# Patient Record
Sex: Female | Born: 1965
Health system: Southern US, Community
[De-identification: ages and names within clinical notes are randomized; demographics above are authoritative.]

## PROBLEM LIST (undated history)

## (undated) DIAGNOSIS — D649 Anemia, unspecified: Secondary | ICD-10-CM

## (undated) HISTORY — PX: TUBAL LIGATION: SHX77

---

## 2002-01-29 ENCOUNTER — Encounter: Payer: Self-pay | Admitting: Internal Medicine

## 2002-01-29 ENCOUNTER — Ambulatory Visit (HOSPITAL_COMMUNITY): Admission: RE | Admit: 2002-01-29 | Discharge: 2002-01-29 | Payer: Self-pay | Admitting: Internal Medicine

## 2002-07-19 ENCOUNTER — Emergency Department (HOSPITAL_COMMUNITY): Admission: EM | Admit: 2002-07-19 | Discharge: 2002-07-19 | Payer: Self-pay | Admitting: Emergency Medicine

## 2002-07-28 ENCOUNTER — Ambulatory Visit (HOSPITAL_COMMUNITY): Admission: RE | Admit: 2002-07-28 | Discharge: 2002-07-28 | Payer: Self-pay | Admitting: Family Medicine

## 2002-07-28 ENCOUNTER — Encounter: Payer: Self-pay | Admitting: Family Medicine

## 2002-09-21 ENCOUNTER — Ambulatory Visit (HOSPITAL_COMMUNITY): Admission: RE | Admit: 2002-09-21 | Discharge: 2002-09-21 | Payer: Self-pay | Admitting: Family Medicine

## 2004-09-13 ENCOUNTER — Ambulatory Visit: Payer: Self-pay | Admitting: Family Medicine

## 2004-09-18 ENCOUNTER — Ambulatory Visit (HOSPITAL_COMMUNITY): Admission: RE | Admit: 2004-09-18 | Discharge: 2004-09-18 | Payer: Self-pay | Admitting: Family Medicine

## 2008-12-08 ENCOUNTER — Encounter: Payer: Self-pay | Admitting: Family Medicine

## 2010-08-11 ENCOUNTER — Ambulatory Visit (INDEPENDENT_AMBULATORY_CARE_PROVIDER_SITE_OTHER): Payer: 59 | Admitting: Family Medicine

## 2010-08-11 ENCOUNTER — Ambulatory Visit: Payer: Self-pay | Admitting: Family Medicine

## 2010-08-11 ENCOUNTER — Encounter: Payer: Self-pay | Admitting: Family Medicine

## 2010-08-11 VITALS — BP 128/84 | HR 84 | Resp 16 | Ht 64.25 in | Wt 168.4 lb

## 2010-08-11 DIAGNOSIS — Z1239 Encounter for other screening for malignant neoplasm of breast: Secondary | ICD-10-CM

## 2010-08-11 DIAGNOSIS — E663 Overweight: Secondary | ICD-10-CM

## 2010-08-11 DIAGNOSIS — Z6825 Body mass index (BMI) 25.0-25.9, adult: Secondary | ICD-10-CM

## 2010-08-11 DIAGNOSIS — N920 Excessive and frequent menstruation with regular cycle: Secondary | ICD-10-CM

## 2010-08-11 NOTE — Assessment & Plan Note (Signed)
Patient will return for a complete GYN physical. She will need a Pap smear done. Will likely obtain pelvic ultrasound to view her endometrial stripe. CBC will also be obtained. She's had a tubal ligation for contraception.

## 2010-08-11 NOTE — Assessment & Plan Note (Signed)
Discussed diet and exercise. Patient will have fasting lipid panel and basic metabolic panel obtained as this has not been done.

## 2010-08-11 NOTE — Progress Notes (Signed)
  Subjective:    Patient ID: Breanna Campbell, female    DOB: 07-12-1965, 45 y.o.   MRN: [id number]  HPI Patient is here to reestablish care. She has not seen her primary doctor in greater than 3 years. Her only concern is abnormal menses over the past 2 months. She has not had a Pap smear in greater than 5 years. She has not had a screening  Mammogram. She's been fairly well otherwise.  Menorrhagia- patient states the past 2 months her cycles have been abnormal. Last month she had one week of spotting, subsequently followed by a regular period which lasted 5 days. She states she has a watery flow and denies any large blood clots. Her typical menstrual discomfort such as abdominal cramps and back pain did not change in severity. She is currently on her menses. Prior to be a regular period she had a cycle approximately every 28 days which lasted for 5 days in a row.  She has not had an eye visit or dental visit New patient is unaware of her family history Review of Systems  Constitutional: Negative for fatigue.       No recent illness  Genitourinary: Positive for menstrual problem. Negative for dysuria, hematuria, vaginal discharge, vaginal pain and pelvic pain.  CVS- no CP, no palpitations RESP- no SOB MSK- no Joint pain or aches      Objective:   Physical Exam GEN- NAD, alert and oriented x3, overweight HEENT- PERRL, EOMI, non injected sclera, pink conjunctiva, MMM, oropharynx clear, fiar dentition, fundoscopic exam benign Neck- Supple, no thryomegaly CVS- RRR, no murmur RESP-CTAB ABD- NABS, soft, NT, ND, no masses EXT- No edema Pulses- Radial, DP- 2+        Assessment & Plan:

## 2010-08-11 NOTE — Patient Instructions (Signed)
Schedule your PAP Smear for a morning appt Do not eat after midnight - to have your labs drawn I will set you up for a Mammogram  Start taking Calcium/Vit D --  1000mg  calcium and 800IU of Vit D,  Or look for multivitamin with calcium  Start and exercise routine - 30 minutes - for 3 days a week

## 2010-10-17 ENCOUNTER — Telehealth: Payer: Self-pay | Admitting: Family Medicine

## 2010-10-19 NOTE — Telephone Encounter (Signed)
Patient is aware 

## 2010-10-20 ENCOUNTER — Ambulatory Visit (HOSPITAL_COMMUNITY)
Admission: RE | Admit: 2010-10-20 | Discharge: 2010-10-20 | Disposition: A | Discharge status: Home / Self Care | Payer: 59 | Source: Ambulatory Visit | Attending: Family Medicine | Admitting: Family Medicine

## 2010-10-20 DIAGNOSIS — Z1239 Encounter for other screening for malignant neoplasm of breast: Secondary | ICD-10-CM

## 2010-10-20 DIAGNOSIS — Z1231 Encounter for screening mammogram for malignant neoplasm of breast: Secondary | ICD-10-CM | POA: Insufficient documentation

## 2010-10-30 ENCOUNTER — Other Ambulatory Visit: Payer: Self-pay | Admitting: Family Medicine

## 2010-10-30 DIAGNOSIS — R928 Other abnormal and inconclusive findings on diagnostic imaging of breast: Secondary | ICD-10-CM

## 2010-11-15 ENCOUNTER — Ambulatory Visit (HOSPITAL_COMMUNITY)
Admission: RE | Admit: 2010-11-15 | Discharge: 2010-11-15 | Disposition: A | Discharge status: Home / Self Care | Payer: 59 | Source: Ambulatory Visit | Attending: Family Medicine | Admitting: Family Medicine

## 2010-11-15 ENCOUNTER — Other Ambulatory Visit (HOSPITAL_COMMUNITY): Payer: Self-pay | Admitting: Family Medicine

## 2010-11-15 DIAGNOSIS — R928 Other abnormal and inconclusive findings on diagnostic imaging of breast: Secondary | ICD-10-CM

## 2011-01-09 HISTORY — PX: LAPAROSCOPIC SUPRACERVICAL HYSTERECTOMY: SUR797

## 2011-03-22 ENCOUNTER — Other Ambulatory Visit (HOSPITAL_COMMUNITY)
Admission: RE | Admit: 2011-03-22 | Discharge: 2011-03-22 | Disposition: A | Discharge status: Home / Self Care | Payer: 59 | Source: Ambulatory Visit | Attending: Family Medicine | Admitting: Family Medicine

## 2011-03-22 ENCOUNTER — Other Ambulatory Visit: Payer: Self-pay | Admitting: Family Medicine

## 2011-03-22 ENCOUNTER — Ambulatory Visit (INDEPENDENT_AMBULATORY_CARE_PROVIDER_SITE_OTHER): Payer: 59 | Admitting: Family Medicine

## 2011-03-22 ENCOUNTER — Encounter: Payer: Self-pay | Admitting: Family Medicine

## 2011-03-22 VITALS — BP 148/84 | HR 95 | Resp 18 | Ht 64.25 in | Wt 167.0 lb

## 2011-03-22 DIAGNOSIS — Z6825 Body mass index (BMI) 25.0-25.9, adult: Secondary | ICD-10-CM

## 2011-03-22 DIAGNOSIS — E663 Overweight: Secondary | ICD-10-CM

## 2011-03-22 DIAGNOSIS — Z01419 Encounter for gynecological examination (general) (routine) without abnormal findings: Secondary | ICD-10-CM

## 2011-03-22 DIAGNOSIS — N920 Excessive and frequent menstruation with regular cycle: Secondary | ICD-10-CM

## 2011-03-22 DIAGNOSIS — Z13220 Encounter for screening for lipoid disorders: Secondary | ICD-10-CM

## 2011-03-22 LAB — BASIC METABOLIC PANEL
BUN: 9 mg/dL (ref 6–23)
Calcium: 9.4 mg/dL (ref 8.4–10.5)
Creat: 0.73 mg/dL (ref 0.50–1.10)
Glucose, Bld: 96 mg/dL (ref 70–99)
Sodium: 138 mEq/L (ref 135–145)

## 2011-03-22 LAB — CBC
HCT: 35 % — ABNORMAL LOW (ref 36.0–46.0)
Platelets: 402 10*3/uL — ABNORMAL HIGH (ref 150–400)
RDW: 13.1 % (ref 11.5–15.5)
WBC: 6 10*3/uL (ref 4.0–10.5)

## 2011-03-22 LAB — LIPID PANEL
Cholesterol: 191 mg/dL (ref 0–200)
HDL: 42 mg/dL (ref >39)
Triglycerides: 100 mg/dL (ref <150)

## 2011-03-22 NOTE — Patient Instructions (Signed)
Get the ultrasound done  I will refer you to GYN pending results Get your labs done fasting- do not eat after midnight We will call with lab results-  Start taking Calcium/Vit D -- 1000mg  calcium and 800IU of Vit D, Or look for multivitamin with calcium  F/U for yearly physicals

## 2011-03-22 NOTE — Assessment & Plan Note (Addendum)
PAP smear obtained today, obtain CBC Will send for ultrasound and refer to GYN after I discuss with patient, I did alert her I will want her to see GYN for colposcopy for the lesion noted As currently off period will not start hormone therapy at this time

## 2011-03-22 NOTE — Progress Notes (Signed)
  Subjective:    Patient ID: Breanna Campbell, female    DOB: [birth year], 46 y.o.   MRN: [id number]  HPI   Patient here for continued heavy menstrual bleeding. She has a cycle every month. She bleeds for possibly 5-6 days. After a couple of hours she soaks through a pad. She often has accidents in her clothing which keeps her from going out in public when her period is on. She is overdue for her Pap smear because of insurance problems in family issues was not able to come in. She denies vaginal discharge.  She had an abnormal mammogram however had repeat followup ultrasound which was normal. She will need another mammogram in one year  She is overdue for labs which she did not get after  the first visit    Review of Systems  GEN- denies fatigue, fever, weight loss,weakness, recent illness HEENT- denies eye drainage, change in vision, nasal discharge, CVS- denies chest pain, palpitations RESP- denies SOB, cough, wheeze ABD- denies N/V, change in stools, abd pain GU- denies dysuria, hematuria, dribbling, incontinence MSK- denies joint pain, muscle aches, injury Neuro- denies headache, dizziness, syncope, seizure activity       Objective:   Physical Exam GEN- NAD, alert and oriented, ABD-soft, NT,ND GU- normal external genitalia, vaginal mucosa pink and moist, cervix visualized white lesion noted at 7 o clock position, minimal bleeding form os,no discharge, no CMT, no ovarian masses, uterus normal size- no large fibroids felt   PAP Smear obtained      Assessment & Plan:

## 2011-03-22 NOTE — Assessment & Plan Note (Signed)
Obtain labs.

## 2011-03-27 ENCOUNTER — Other Ambulatory Visit: Payer: Self-pay | Admitting: Family Medicine

## 2011-03-27 ENCOUNTER — Ambulatory Visit (HOSPITAL_COMMUNITY)
Admission: RE | Admit: 2011-03-27 | Discharge: 2011-03-27 | Disposition: A | Discharge status: Home / Self Care | Payer: 59 | Source: Ambulatory Visit | Attending: Family Medicine | Admitting: Family Medicine

## 2011-03-27 ENCOUNTER — Ambulatory Visit (HOSPITAL_COMMUNITY): Payer: 59

## 2011-03-27 DIAGNOSIS — D259 Leiomyoma of uterus, unspecified: Secondary | ICD-10-CM | POA: Insufficient documentation

## 2011-03-27 DIAGNOSIS — N920 Excessive and frequent menstruation with regular cycle: Secondary | ICD-10-CM

## 2011-03-27 DIAGNOSIS — N938 Other specified abnormal uterine and vaginal bleeding: Secondary | ICD-10-CM | POA: Insufficient documentation

## 2011-03-27 DIAGNOSIS — N949 Unspecified condition associated with female genital organs and menstrual cycle: Secondary | ICD-10-CM | POA: Insufficient documentation

## 2011-03-27 DIAGNOSIS — M549 Dorsalgia, unspecified: Secondary | ICD-10-CM | POA: Insufficient documentation

## 2011-03-28 ENCOUNTER — Telehealth: Payer: Self-pay | Admitting: Family Medicine

## 2011-03-28 DIAGNOSIS — D259 Leiomyoma of uterus, unspecified: Secondary | ICD-10-CM

## 2011-03-28 DIAGNOSIS — N920 Excessive and frequent menstruation with regular cycle: Secondary | ICD-10-CM

## 2011-03-28 DIAGNOSIS — N889 Noninflammatory disorder of cervix uteri, unspecified: Secondary | ICD-10-CM

## 2011-03-28 LAB — IRON AND TIBC
%SAT: 22 % (ref 20–55)
TIBC: 393 ug/dL (ref 250–470)
UIBC: 308 ug/dL (ref 125–400)

## 2011-03-28 NOTE — Telephone Encounter (Signed)
Called and gave lab results and pap results.

## 2011-03-28 NOTE — Telephone Encounter (Signed)
[facility]

## 2011-03-28 NOTE — Telephone Encounter (Signed)
Pt has already been referred.

## 2011-03-28 NOTE — Telephone Encounter (Signed)
Called patient and left message for them to return call at the office   

## 2011-03-28 NOTE — Telephone Encounter (Signed)
Pt has multiple moderate size fibroids, will need GYN referral. Also needs colposcopy for lesion on cervix, Please let her know I have referred her to GYN

## 2011-05-02 ENCOUNTER — Other Ambulatory Visit: Payer: Self-pay | Admitting: Obstetrics and Gynecology

## 2011-05-04 ENCOUNTER — Other Ambulatory Visit: Payer: Self-pay | Admitting: Obstetrics and Gynecology

## 2011-05-04 ENCOUNTER — Encounter (HOSPITAL_COMMUNITY)
Admission: RE | Admit: 2011-05-04 | Discharge: 2011-05-04 | Disposition: A | Discharge status: Home / Self Care | Payer: 59 | Source: Ambulatory Visit | Attending: Obstetrics and Gynecology | Admitting: Obstetrics and Gynecology

## 2011-05-04 ENCOUNTER — Encounter (HOSPITAL_COMMUNITY): Payer: Self-pay

## 2011-05-04 ENCOUNTER — Encounter (HOSPITAL_COMMUNITY): Payer: Self-pay | Admitting: Pharmacy Technician

## 2011-05-04 HISTORY — DX: Anemia, unspecified: D64.9

## 2011-05-04 LAB — SURGICAL PCR SCREEN
MRSA, PCR: NEGATIVE
Staphylococcus aureus: NEGATIVE

## 2011-05-04 LAB — COMPREHENSIVE METABOLIC PANEL
ALT: 6 U/L (ref 0–35)
Albumin: 3.7 g/dL (ref 3.5–5.2)
Alkaline Phosphatase: 72 U/L (ref 39–117)
BUN: 10 mg/dL (ref 6–23)
Calcium: 9.6 mg/dL (ref 8.4–10.5)
GFR calc Af Amer: >90 mL/min (ref >90)
Potassium: 4 mEq/L (ref 3.5–5.1)
Sodium: 136 mEq/L (ref 135–145)
Total Protein: 8.1 g/dL (ref 6.0–8.3)

## 2011-05-04 LAB — TYPE AND SCREEN: Antibody Screen: NEGATIVE

## 2011-05-04 LAB — CBC
MCH: 27.7 pg (ref 26.0–34.0)
MCHC: 32.6 g/dL (ref 30.0–36.0)
RDW: 12.9 % (ref 11.5–15.5)

## 2011-05-04 LAB — HCG, SERUM, QUALITATIVE: Preg, Serum: NEGATIVE

## 2011-05-04 MED ORDER — MAGNESIUM CITRATE PO SOLN: 300.0000 mL | Freq: Once | ORAL | Status: DC

## 2011-05-04 NOTE — Patient Instructions (Addendum)
20 Breanna Campbell  05/04/2011   Your procedure is scheduled on:  05/08/2011  Report to [facility] at  0930  AM.  Call this number if you have problems the morning of surgery: [phone]   Remember:   Do not eat food:After Midnight.  May have clear liquids:until Midnight .  Clear liquids include soda, tea, black coffee, apple or grape juice, broth.  Take these medicines the morning of surgery with A SIP OF WATER:none   Do not wear jewelry, make-up or nail polish.  Do not wear lotions, powders, or perfumes. You may wear deodorant.  Do not shave 48 hours prior to surgery.  Do not bring valuables to the hospital.  Contacts, dentures or bridgework may not be worn into surgery.  Leave suitcase in the car. After surgery it may be brought to your room.  For patients admitted to the hospital, checkout time is 11:00 AM the day of discharge.   Patients discharged the day of surgery will not be allowed to drive home.  Name and phone number of your driver: family  Special Instructions: CHG Shower Use Special Wash: 1/2 bottle night before surgery and 1/2 bottle morning of surgery.   Please read over the following fact sheets that you were given: Pain Booklet, MRSA Information, Surgical Site Infection Prevention, Anesthesia Post-op Instructions and Care and Recovery After Surgery Supracervical Hysterectomy A supracervical hysterectomy is minimally invasive surgery to remove the top part of the uterus, but not the cervix. This surgery can be performed by making a large cut (incision) in the abdomen. It can also be done with a thin, lighted tube (laparoscope) inserted into 2 small incisions in the lower abdomen. Your fallopian tubes and ovaries can be removed (bilateral salpingo-oopherectomy) during this surgery as well. If a supracervical hysterectomy is started and it is not safe to continue, the laparoscopic surgery will be converted to an open abdominal surgery. You will not have menstrual periods or  be able to get pregnant after having this surgery. If a bilateral salpingo-oopherectomy was performed before menopause, you will go through a sudden (abrupt) menopause. This can be helped with hormone medicines. Benefits of minimally invasive surgery include:  Less pain.   Less risk of blood loss.   Less risk of infection.   Quicker return to normal activities.   Usually a 1 night stay in the hospital.   Overall patient satisfaction.  LET YOUR CAREGIVER KNOW ABOUT:  Any history of abnormal Pap tests.   Allergies to food or medicine.   Medicines taken, including vitamins, herbs, eyedrops, over-the-counter medicines, and creams.   Use of steroids (by mouth or creams).   Previous problems with anesthetics or numbing medicines.   History of bleeding problems or blood clots.   Previous surgery.   Other health problems, including diabetes and kidney problems.   Any infections or colds you may have developed.   Symptoms of irregular or heavy periods, weight loss, or urinary or bowel changes.  RISKS AND COMPLICATIONS   Bleeding.   Blood clots in the legs or lung.   Infection.   Injury to surrounding organs.   Problems with anesthesia.   Risk of conversion to an open abdominal incision.   Early menopause symptoms (hot flashes, night sweats, insomnia).   Additional surgery later to remove the cervix if you have problems with the cervix.  BEFORE THE PROCEDURE  Ask your caregiver about changing or stopping your regular medicines.   Do not take aspirin or blood  thinners (anticoagulants) for 1 week before the surgery, or as told by your caregiver.   Do not eat or drink anything for 8 hours before the surgery, or as told by your caregiver.   Quit smoking if you smoke.   Arrange for a ride home after surgery and for someone to help you at home during recovery.  PROCEDURE   You will be given an antibiotic medicine.   An intravenous (IV) line will be placed in your  arm. You will be given medicine to make you sleep (general anesthetic).   A gas (carbon dioxide) will be used to inflate your abdomen. This will allow your surgeon to look inside your abdomen, perform your surgery, and treat any other problems found if necessary.   Three or four small incisions (often less than  inch) will be made in your abdomen. One of these incisions will be made in the area of your belly button (navel). The laparoscope will be inserted into the incision. Your surgeon will look through the laparoscope while doing your procedure.   Other surgical instruments will be inserted through the other incisions.   The uterus will be cut into small pieces and removed through the small incisions.   Your incisions will be closed.  AFTER THE PROCEDURE   The gas will be released from inside your abdomen.   You will be taken to the recovery area where a nurse will watch and check your progress. Once you are awake, stable, and taking fluids well, without other problems, you will return to your room or be allowed to go home.   There is usually minimal discomfort following the surgery because the incisions are so small.   You will be given pain medicine while you are in the hospital and for when you go home.   Try to have someone with you for the first 3 to 5 days after you go home.   Follow up with your surgeon in 2 to 4 weeks after surgery to evaluate your progress.  Document Released: 06/13/2007 Document Revised: 12/14/2010 Document Reviewed: 08/11/2010 [facility] Patient Information 2012 [place], [place].PATIENT INSTRUCTIONS POST-ANESTHESIA  IMMEDIATELY FOLLOWING SURGERY:  Do not drive or operate machinery for the first twenty four hours after surgery.  Do not make any important decisions for twenty four hours after surgery or while taking narcotic pain medications or sedatives.  If you develop intractable nausea and vomiting or a severe headache please notify your doctor  immediately.  FOLLOW-UP:  Please make an appointment with your surgeon as instructed. You do not need to follow up with anesthesia unless specifically instructed to do so.  WOUND CARE INSTRUCTIONS (if applicable):  Keep a dry clean dressing on the anesthesia/puncture wound site if there is drainage.  Once the wound has quit draining you may leave it open to air.  Generally you should leave the bandage intact for twenty four hours unless there is drainage.  If the epidural site drains for more than 36-48 hours please call the anesthesia department.  QUESTIONS?:  Please feel free to call your physician or the hospital operator if you have any questions, and they will be happy to assist you.     [facility] Anesthesia Department [address] [place] [phone]

## 2011-05-07 NOTE — H&P (Signed)
Breanna Campbell is an 46 y.o. female. She is admitted for abdominal supracervical hysterectomy due to symptomatic uterine fibroid 12-14 weeks in size . Endometrial biopsy in the office 05/02/2011 was normal. She is referred from Dr. Soundra Pilon for heavy periods and fibroid uterus. She had a small nabothian cyst on the cervix considered benign. The endometrial cavity is distorted by the fibroids and  9 mm in thickness, and has been biopsied.  Pertinent Gynecological History: Menses: flow is excessive with use of Several pads or tampons on heaviest days She has mild anemia with hemoglobin 11.8 Bleeding: No intermenstrual bleeding or post coital bleeding mentioned. Contraception: tubal ligation DES exposure: unknown Blood transfusions: none Sexually transmitted diseases: no past history Previous GYN Procedures: None  Last mammogram: Followed by Dr. Jeanice Lim Date:  Last pap: normal Date:  OB History: G2, P2   Menstrual History: Menarche age: No LMP recorded. 04/10/2011    Past Medical History  Diagnosis Date  . Anemia     Past Surgical History  Procedure Date  . Tubal ligation     Family History  Problem Relation Age of Onset  . Anesthesia problems Neg Hx   . Hypotension Neg Hx   . Malignant hyperthermia Neg Hx   . Pseudochol deficiency Neg Hx     Social History:  reports that she has never smoked. She does not have any smokeless tobacco history on file. She reports that she does not drink alcohol or use illicit drugs.  Allergies: No Known Allergies  No prescriptions prior to admission    ROS  There were no vitals taken for this visit. weight 166.2 pounds blood pressure 130/80  Physical Exam  Constitutional: She is oriented to person, place, and time. She appears well-developed and well-nourished.  HENT:  Head: Normocephalic and atraumatic.  Eyes: Pupils are equal, round, and reactive to light.  Neck: Neck supple. No JVD present. No thyromegaly present.  Cardiovascular:  Normal rate and regular rhythm.   Respiratory: Effort normal and breath sounds normal. She has no wheezes. She has no rales.  GI: Soft. Bowel sounds are normal. She exhibits no distension. There is no tenderness.  Genitourinary: Vagina normal.  Musculoskeletal: Normal range of motion. She exhibits no edema and no tenderness.  Neurological: She is alert and oriented to person, place, and time. She has normal reflexes. No cranial nerve deficit. She exhibits abnormal muscle tone. Coordination normal.  Skin: Skin is warm. No rash noted. No erythema. No pallor.  Psychiatric: She has a normal mood and affect. Her behavior is normal. Thought content normal.    No results found for this or any previous visit (from the past 24 hour(s)). CBC    Component Value Date/Time   WBC 6.9 05/04/2011 1430   RBC 3.75* 05/04/2011 1430   HGB 10.4* 05/04/2011 1430   HCT 31.9* 05/04/2011 1430   PLT 300 05/04/2011 1430   MCV 85.1 05/04/2011 1430   MCH 27.7 05/04/2011 1430   MCHC 32.6 05/04/2011 1430   RDW 12.9 05/04/2011 1430   Pap smear 03/22/2011 negative for abnormality Endometrial biopsy 05/02/2011 secretory phase endometrium  No results found.  Assessment/Plan: Uterine fibroids, 12-14 weeks size., for Abdominal Supracervical hysterectomy.  [name] V 05/07/2011, 11:27 PM

## 2011-05-08 ENCOUNTER — Encounter (HOSPITAL_COMMUNITY): Payer: Self-pay | Admitting: Anesthesiology

## 2011-05-08 ENCOUNTER — Encounter (HOSPITAL_COMMUNITY): Payer: Self-pay | Admitting: *Deleted

## 2011-05-08 ENCOUNTER — Inpatient Hospital Stay (HOSPITAL_COMMUNITY)
Admission: RE | Admit: 2011-05-08 | Discharge: 2011-05-10 | DRG: 743 | Disposition: A | Discharge status: Home / Self Care | Payer: 59 | Source: Ambulatory Visit | Attending: Obstetrics and Gynecology | Admitting: Obstetrics and Gynecology

## 2011-05-08 ENCOUNTER — Ambulatory Visit (HOSPITAL_COMMUNITY): Payer: 59 | Admitting: Anesthesiology

## 2011-05-08 ENCOUNTER — Encounter (HOSPITAL_COMMUNITY)
Admission: RE | Disposition: A | Discharge status: Home / Self Care | Payer: Self-pay | Source: Ambulatory Visit | Attending: Obstetrics and Gynecology

## 2011-05-08 DIAGNOSIS — D259 Leiomyoma of uterus, unspecified: Principal | ICD-10-CM | POA: Diagnosis present

## 2011-05-08 HISTORY — DX: Leiomyoma of uterus, unspecified: D25.9

## 2011-05-08 SURGERY — HYSTERECTOMY, SUPRACERVICAL, ABDOMINAL
Anesthesia: General | Wound class: Clean

## 2011-05-08 MED ORDER — LIDOCAINE HCL 1 % IJ SOLN
INTRAMUSCULAR | Status: DC | PRN
  Administered 2011-05-08: 40 mg via INTRADERMAL

## 2011-05-08 MED ORDER — 0.9 % SODIUM CHLORIDE (POUR BTL) OPTIME
TOPICAL | Status: DC | PRN
  Administered 2011-05-08: 2000 mL

## 2011-05-08 MED ORDER — PROPOFOL 10 MG/ML IV BOLUS
INTRAVENOUS | Status: DC | PRN
  Administered 2011-05-08: 150 mg via INTRAVENOUS

## 2011-05-08 MED ORDER — CEFAZOLIN SODIUM-DEXTROSE 2-3 GM-% IV SOLR: 2.0000 g | INTRAVENOUS | Status: DC

## 2011-05-08 MED ORDER — KETOROLAC TROMETHAMINE 30 MG/ML IJ SOLN
30.0000 mg | Freq: Four times a day (QID) | INTRAMUSCULAR | Status: AC
  Administered 2011-05-09 (×4): 30 mg via INTRAVENOUS
  Filled 2011-05-08 (×4): qty 1

## 2011-05-08 MED ORDER — LACTATED RINGERS IV SOLN
INTRAVENOUS | Status: DC
  Administered 2011-05-08: 13:00:00 via INTRAVENOUS
  Administered 2011-05-08: 1000 mL via INTRAVENOUS

## 2011-05-08 MED ORDER — HYDROMORPHONE 0.3 MG/ML IV SOLN
INTRAVENOUS | Status: DC
  Administered 2011-05-08: 3 mg via INTRAVENOUS
  Administered 2011-05-08: 0.6 mg via INTRAVENOUS
  Administered 2011-05-09: 1 mg via INTRAVENOUS

## 2011-05-08 MED ORDER — ROCURONIUM BROMIDE 100 MG/10ML IV SOLN
INTRAVENOUS | Status: DC | PRN
  Administered 2011-05-08: 35 mg via INTRAVENOUS

## 2011-05-08 MED ORDER — CEFAZOLIN SODIUM 1-5 GM-% IV SOLN
INTRAVENOUS | Status: DC | PRN
  Administered 2011-05-08: 1 g via INTRAVENOUS

## 2011-05-08 MED ORDER — PANTOPRAZOLE SODIUM 40 MG IV SOLR
40.0000 mg | Freq: Every day | INTRAVENOUS | Status: DC
  Administered 2011-05-08 – 2011-05-09 (×2): 40 mg via INTRAVENOUS
  Filled 2011-05-08 (×2): qty 40

## 2011-05-08 MED ORDER — ONDANSETRON HCL 4 MG/2ML IJ SOLN: 4.0000 mg | Freq: Four times a day (QID) | INTRAMUSCULAR | Status: DC | PRN

## 2011-05-08 MED ORDER — ONDANSETRON HCL 4 MG/2ML IJ SOLN
4.0000 mg | Freq: Once | INTRAMUSCULAR | Status: AC
  Administered 2011-05-08: 4 mg via INTRAVENOUS

## 2011-05-08 MED ORDER — NEOSTIGMINE METHYLSULFATE 1 MG/ML IJ SOLN
INTRAMUSCULAR | Status: DC | PRN
  Administered 2011-05-08: 3 mg via INTRAVENOUS

## 2011-05-08 MED ORDER — SODIUM CHLORIDE 0.9 % IJ SOLN: 9.0000 mL | INTRAMUSCULAR | Status: DC | PRN

## 2011-05-08 MED ORDER — FENTANYL CITRATE 0.05 MG/ML IJ SOLN
INTRAMUSCULAR | Status: AC
  Administered 2011-05-08: 50 ug via INTRAVENOUS
  Filled 2011-05-08: qty 2

## 2011-05-08 MED ORDER — ACETAMINOPHEN 325 MG PO TABS: 650.0000 mg | ORAL_TABLET | ORAL | Status: DC | PRN

## 2011-05-08 MED ORDER — GLYCOPYRROLATE 0.2 MG/ML IJ SOLN
INTRAMUSCULAR | Status: DC | PRN
  Administered 2011-05-08: .5 mg via INTRAVENOUS

## 2011-05-08 MED ORDER — SIMETHICONE 80 MG PO CHEW: 80.0000 mg | CHEWABLE_TABLET | Freq: Four times a day (QID) | ORAL | Status: DC | PRN

## 2011-05-08 MED ORDER — GLYCOPYRROLATE 0.2 MG/ML IJ SOLN
INTRAMUSCULAR | Status: AC
  Filled 2011-05-08: qty 2

## 2011-05-08 MED ORDER — NALOXONE HCL 0.4 MG/ML IJ SOLN: 0.4000 mg | INTRAMUSCULAR | Status: DC | PRN

## 2011-05-08 MED ORDER — FENTANYL CITRATE 0.05 MG/ML IJ SOLN
25.0000 ug | INTRAMUSCULAR | Status: DC | PRN
  Administered 2011-05-08: 50 ug via INTRAVENOUS
  Administered 2011-05-08: 25 ug via INTRAVENOUS

## 2011-05-08 MED ORDER — KETOROLAC TROMETHAMINE 30 MG/ML IJ SOLN: 30.0000 mg | Freq: Four times a day (QID) | INTRAMUSCULAR | Status: AC

## 2011-05-08 MED ORDER — FENTANYL CITRATE 0.05 MG/ML IJ SOLN
INTRAMUSCULAR | Status: AC
  Administered 2011-05-08: 25 ug via INTRAVENOUS
  Filled 2011-05-08: qty 5

## 2011-05-08 MED ORDER — DIPHENHYDRAMINE HCL 12.5 MG/5ML PO ELIX: 12.5000 mg | ORAL_SOLUTION | Freq: Four times a day (QID) | ORAL | Status: DC | PRN

## 2011-05-08 MED ORDER — ONDANSETRON HCL 4 MG/2ML IJ SOLN
INTRAMUSCULAR | Status: AC
  Administered 2011-05-08: 4 mg via INTRAVENOUS
  Filled 2011-05-08: qty 2

## 2011-05-08 MED ORDER — CEFAZOLIN SODIUM-DEXTROSE 2-3 GM-% IV SOLR
INTRAVENOUS | Status: AC
  Filled 2011-05-08: qty 50

## 2011-05-08 MED ORDER — FENTANYL CITRATE 0.05 MG/ML IJ SOLN
INTRAMUSCULAR | Status: DC | PRN
  Administered 2011-05-08 (×5): 50 ug via INTRAVENOUS

## 2011-05-08 MED ORDER — OXYCODONE-ACETAMINOPHEN 5-325 MG PO TABS: 1.0000 | ORAL_TABLET | ORAL | Status: DC | PRN

## 2011-05-08 MED ORDER — ONDANSETRON HCL 4 MG/2ML IJ SOLN: 4.0000 mg | Freq: Once | INTRAMUSCULAR | Status: DC | PRN

## 2011-05-08 MED ORDER — MIDAZOLAM HCL 2 MG/2ML IJ SOLN
1.0000 mg | INTRAMUSCULAR | Status: DC | PRN
  Administered 2011-05-08 (×2): 2 mg via INTRAVENOUS

## 2011-05-08 MED ORDER — ONDANSETRON HCL 4 MG PO TABS: 4.0000 mg | ORAL_TABLET | Freq: Four times a day (QID) | ORAL | Status: DC | PRN

## 2011-05-08 MED ORDER — FENTANYL CITRATE 0.05 MG/ML IJ SOLN
INTRAMUSCULAR | Status: AC
  Filled 2011-05-08: qty 5

## 2011-05-08 MED ORDER — SODIUM CHLORIDE 0.9 % IJ SOLN
INTRAMUSCULAR | Status: AC
  Administered 2011-05-08: 22:00:00
  Filled 2011-05-08: qty 3

## 2011-05-08 MED ORDER — KETOROLAC TROMETHAMINE 30 MG/ML IJ SOLN
30.0000 mg | Freq: Once | INTRAMUSCULAR | Status: AC
  Administered 2011-05-08: 30 mg via INTRAVENOUS

## 2011-05-08 MED ORDER — MIDAZOLAM HCL 2 MG/2ML IJ SOLN
INTRAMUSCULAR | Status: AC
  Administered 2011-05-08: 2 mg via INTRAVENOUS
  Filled 2011-05-08: qty 2

## 2011-05-08 MED ORDER — DOCUSATE SODIUM 100 MG PO CAPS
100.0000 mg | ORAL_CAPSULE | Freq: Two times a day (BID) | ORAL | Status: DC
  Administered 2011-05-09 – 2011-05-10 (×3): 100 mg via ORAL
  Filled 2011-05-08 (×3): qty 1

## 2011-05-08 MED ORDER — KETOROLAC TROMETHAMINE 30 MG/ML IJ SOLN
INTRAMUSCULAR | Status: AC
  Administered 2011-05-08: 30 mg via INTRAVENOUS
  Filled 2011-05-08: qty 1

## 2011-05-08 MED ORDER — KCL IN DEXTROSE-NACL 20-5-0.45 MEQ/L-%-% IV SOLN
INTRAVENOUS | Status: DC
  Administered 2011-05-08 – 2011-05-09 (×2): via INTRAVENOUS

## 2011-05-08 MED ORDER — DIPHENHYDRAMINE HCL 50 MG/ML IJ SOLN: 12.5000 mg | Freq: Four times a day (QID) | INTRAMUSCULAR | Status: DC | PRN

## 2011-05-08 MED ORDER — ZOLPIDEM TARTRATE 5 MG PO TABS: 5.0000 mg | ORAL_TABLET | Freq: Every evening | ORAL | Status: DC | PRN

## 2011-05-08 MED ORDER — HYDROMORPHONE 0.3 MG/ML IV SOLN
INTRAVENOUS | Status: AC
  Administered 2011-05-08: 16:00:00
  Filled 2011-05-08: qty 25

## 2011-05-08 SURGICAL SUPPLY — 55 items
BAG HAMPER (MISCELLANEOUS) ×2 IMPLANT
BENZOIN TINCTURE PRP APPL 2/3 (GAUZE/BANDAGES/DRESSINGS) ×2 IMPLANT
CLOTH BEACON ORANGE TIMEOUT ST (SAFETY) ×2 IMPLANT
COVER SURGICAL LIGHT HANDLE (MISCELLANEOUS) ×4 IMPLANT
DRAPE WARM FLUID 44X44 (DRAPE) ×2 IMPLANT
DRESSING TELFA 8X3 (GAUZE/BANDAGES/DRESSINGS) ×2 IMPLANT
ELECT REM PT RETURN 9FT ADLT (ELECTROSURGICAL) ×2
ELECTRODE REM PT RTRN 9FT ADLT (ELECTROSURGICAL) ×1 IMPLANT
EVACUATOR DRAINAGE 10X20 100CC (DRAIN) IMPLANT
EVACUATOR SILICONE 100CC (DRAIN)
FORMALIN 10 PREFIL 480ML (MISCELLANEOUS) IMPLANT
GLOVE BIOGEL PI IND STRL 7.0 (GLOVE) ×3 IMPLANT
GLOVE BIOGEL PI IND STRL 9 (GLOVE) ×1 IMPLANT
GLOVE BIOGEL PI INDICATOR 7.0 (GLOVE) ×3
GLOVE BIOGEL PI INDICATOR 9 (GLOVE) ×1
GLOVE ECLIPSE 6.5 STRL STRAW (GLOVE) ×4 IMPLANT
GLOVE ECLIPSE 7.0 STRL STRAW (GLOVE) ×4 IMPLANT
GLOVE ECLIPSE 9.0 STRL (GLOVE) ×2 IMPLANT
GLOVE SS BIOGEL STRL SZ 6.5 (GLOVE) ×1 IMPLANT
GLOVE SS BIOGEL STRL SZ 7 (GLOVE) ×1 IMPLANT
GLOVE SUPERSENSE BIOGEL SZ 6.5 (GLOVE) ×1
GLOVE SUPERSENSE BIOGEL SZ 7 (GLOVE) ×1
GOWN STRL REIN 3XL LVL4 (GOWN DISPOSABLE) ×2 IMPLANT
GOWN STRL REIN XL XLG (GOWN DISPOSABLE) ×4 IMPLANT
INST SET MAJOR GENERAL (KITS) ×2 IMPLANT
KIT ROOM TURNOVER APOR (KITS) ×2 IMPLANT
MANIFOLD NEPTUNE II (INSTRUMENTS) ×2 IMPLANT
NS IRRIG 1000ML POUR BTL (IV SOLUTION) ×4 IMPLANT
PACK ABDOMINAL MAJOR (CUSTOM PROCEDURE TRAY) ×2 IMPLANT
PAD ARMBOARD 7.5X6 YLW CONV (MISCELLANEOUS) ×2 IMPLANT
RETRACTOR WND ALEXIS 25 LRG (MISCELLANEOUS) ×1 IMPLANT
RTRCTR WOUND ALEXIS 25CM LRG (MISCELLANEOUS) ×2
SET BASIN LINEN APH (SET/KITS/TRAYS/PACK) ×2 IMPLANT
SOL PREP PROV IODINE SCRUB 4OZ (MISCELLANEOUS) ×2 IMPLANT
SPONGE DRAIN TRACH 4X4 STRL 2S (GAUZE/BANDAGES/DRESSINGS) IMPLANT
SPONGE GAUZE 4X4 12PLY (GAUZE/BANDAGES/DRESSINGS) ×2 IMPLANT
SPONGE LAP 18X18 X RAY DECT (DISPOSABLE) IMPLANT
STAPLER VISISTAT 35W (STAPLE) IMPLANT
STRIP CLOSURE SKIN 1/2X4 (GAUZE/BANDAGES/DRESSINGS) ×2 IMPLANT
SUT CHROMIC 0 CT 1 (SUTURE) ×24 IMPLANT
SUT CHROMIC 2 0 CT 1 (SUTURE) ×2 IMPLANT
SUT CHROMIC GUT BROWN 0 54 (SUTURE) IMPLANT
SUT CHROMIC GUT BROWN 0 54IN (SUTURE)
SUT ETHILON 3 0 FSL (SUTURE) IMPLANT
SUT PDS AB CT VIOLET #0 27IN (SUTURE) IMPLANT
SUT PLAIN CT 1/2CIR 2-0 27IN (SUTURE) ×2 IMPLANT
SUT PROLENE 0 CT 1 30 (SUTURE) IMPLANT
SUT VIC AB 0 CT1 27 (SUTURE) ×1
SUT VIC AB 0 CT1 27XBRD ANTBC (SUTURE) ×1 IMPLANT
SUT VICRYL 4 0 KS 27 (SUTURE) ×2 IMPLANT
SUT VICRYL AB 2 0 TIES (SUTURE) ×2 IMPLANT
TAPE CLOTH SURG 4X10 WHT LF (GAUZE/BANDAGES/DRESSINGS) ×2 IMPLANT
TOWEL BLUE STERILE X RAY DET (MISCELLANEOUS) ×2 IMPLANT
TOWEL OR 17X26 4PK STRL BLUE (TOWEL DISPOSABLE) IMPLANT
TRAY FOLEY CATH 14FR (SET/KITS/TRAYS/PACK) ×2 IMPLANT

## 2011-05-08 NOTE — Anesthesia Procedure Notes (Signed)
Procedure Name: Intubation Date/Time: 05/08/2011 12:28 PM Performed by: Glynn Octave E Pre-anesthesia Checklist: Patient identified, Patient being monitored, Timeout performed, Emergency Drugs available and Suction available Patient Re-evaluated:Patient Re-evaluated prior to inductionOxygen Delivery Method: Circle System Utilized Preoxygenation: Pre-oxygenation with 100% oxygen Intubation Type: IV induction Ventilation: Mask ventilation without difficulty Laryngoscope Size: Mac and 3 Grade View: Grade I Tube type: Oral Tube size: 7.0 mm Number of attempts: 1 Airway Equipment and Method: stylet Placement Confirmation: ETT inserted through vocal cords under direct vision,  positive ETCO2 and breath sounds checked- equal and bilateral Secured at: 21 cm Tube secured with: Tape Dental Injury: Teeth and Oropharynx as per pre-operative assessment

## 2011-05-08 NOTE — Anesthesia Preprocedure Evaluation (Signed)
Anesthesia Evaluation  Patient identified by MRN, date of birth, ID band Patient awake    Reviewed: Allergy & Precautions, H&P , NPO status , Patient's Chart, lab work & pertinent test results  Airway Mallampati: I  Neck ROM: Full    Dental  (+) Poor Dentition and Chipped   Pulmonary neg pulmonary ROS,  breath sounds clear to auscultation        Cardiovascular negative cardio ROS  Rhythm:Regular Rate:Normal     Neuro/Psych    GI/Hepatic   Endo/Other    Renal/GU      Musculoskeletal   Abdominal   Peds  Hematology  (+) Blood dyscrasia, anemia ,   Anesthesia Other Findings   Reproductive/Obstetrics                           Anesthesia Physical Anesthesia Plan  ASA: II  Anesthesia Plan: General   Post-op Pain Management:    Induction: Intravenous  Airway Management Planned: Oral ETT  Additional Equipment:   Intra-op Plan:   Post-operative Plan: Extubation in OR  Informed Consent: I have reviewed the patients History and Physical, chart, labs and discussed the procedure including the risks, benefits and alternatives for the proposed anesthesia with the patient or authorized representative who has indicated his/her understanding and acceptance.     Plan Discussed with:   Anesthesia Plan Comments:         Anesthesia Quick Evaluation

## 2011-05-08 NOTE — Progress Notes (Signed)
Received report from Gena.

## 2011-05-08 NOTE — Op Note (Signed)
See details of operative note completed inside the brief operative note

## 2011-05-08 NOTE — Anesthesia Postprocedure Evaluation (Signed)
  Anesthesia Post-op Note  Patient: Breanna Campbell  Procedure(s) Performed: Procedure(s) (LRB): HYSTERECTOMY SUPRACERVICAL ABDOMINAL (N/A)  Patient Location: PACU  Anesthesia Type: General  Level of Consciousness: sedated  Airway and Oxygen Therapy: Patient Spontanous Breathing and Patient connected to face mask oxygen  Post-op Pain: none  Post-op Assessment: Post-op Vital signs reviewed, Patient's Cardiovascular Status Stable, Respiratory Function Stable, Patent Airway and No signs of Nausea or vomiting  Post-op Vital Signs: Reviewed and stable  Complications: No apparent anesthesia complications

## 2011-05-08 NOTE — Brief Op Note (Signed)
05/08/2011  2:14 PM  PATIENT:  Breanna Campbell  46 y.o. female  PRE-OPERATIVE DIAGNOSIS:  uterine fibroids menometrorrhagia anemia secondary to blood loss  POST-OPERATIVE DIAGNOSIS:  uterine fibroids Menometrorrhagia, anemia  PROCEDURE:  Procedure(s) (LRB): HYSTERECTOMY SUPRACERVICAL ABDOMINAL (N/A)  SURGEON:  Surgeon(s) and Role:    * Tilda Burrow, MD - Primary  PHYSICIAN ASSISTANT:   ASSISTANTSAnnabell Howells, RN FA   ANESTHESIA:   general  EBL:  Total I/O In: 1400 [I.V.:1400] Out: 370 [Urine:270; Blood:100]  BLOOD ADMINISTERED:none  DRAINS: Urinary Catheter (Foley)   LOCAL MEDICATIONS USED:  NONE  SPECIMEN:  Source of Specimen:  uterus  DISPOSITION OF SPECIMEN:  PATHOLOGY  COUNTS:  YES  TOURNIQUET:  * No tourniquets in log *  DICTATION: .Dragon Dictation Patient was taken operating room prepped and draped for lower surgery with Foley catheter in place and vaginal prep performed. Flowtron were in place, the patient received IV antibiotics preprocedure. A Pfannensteil type incision was performed and abdominal cavity opened without difficulty. an Alexis  wound retractor was placed inside the abdomen to hold the abdominal cavity opened, moist laparotomy tapes placed and the bowel packed away. Uterus was present 10 cm in transverse diameter and filled the incision. Round ligaments were quite thickened 1 cm diameter but could be taken down bilaterally with a Kelly clamps, cut, 0 chromic suture ligature, and transection. Bladder flap was developed on the anterior lower uterine segment. Bladder flap tissues were thickened. The right utero-ovarian ligament could be doubly clamped cut and taken down with 0 chromic suture ligature. Both sides were performed similarly uterine vessels were splayed on the left side by large fibroid which spread the 5 the blood vessels. 2 curved Heaney clamps and they Kelly clamps were used to cross clamp the vessels transect them in and then the upper  cardinal ligaments treated similarly. Right side was treated similarly. The largest uterine fundus could be extended off the lower uterine segment at this point and one additional clamping on either side of the lower uterine segment could be performed before the lower portion of the lower uterine segment was amputated off the cervix. The cord out portion of the remaining cervix was oversewn with good hemostasis resulting. Oversewn with a 0 chromic interrupted sutures x4.   Anterior peritoneum was closed with 2-0 chromic, fascia with running 0 Vicryl, subcutaneous tissues with interrupted 2-0 plain, and then subcuticular 4-0 Vicryl on a Keith needle used to close the skin. Urine remained clear and generous in amount. Patient was allowed to awaken and go to recovery room in good condition sponge and needle counts correct. PLAN OF CARE: Admit to inpatient   PATIENT DISPOSITION:  PACU - hemodynamically stable.   Delay start of Pharmacological VTE agent (>24hrs) due to surgical blood loss or risk of bleeding: yes

## 2011-05-08 NOTE — Transfer of Care (Signed)
Immediate Anesthesia Transfer of Care Note  Patient: Breanna Campbell  Procedure(s) Performed: Procedure(s) (LRB): HYSTERECTOMY SUPRACERVICAL ABDOMINAL (N/A)  Patient Location: PACU  Anesthesia Type: General  Level of Consciousness: sedated  Airway & Oxygen Therapy: Patient Spontanous Breathing and Patient connected to face mask oxygen  Post-op Assessment: Report given to PACU RN  Post vital signs: Reviewed and stable  Complications: No apparent anesthesia complications

## 2011-05-08 NOTE — Interval H&P Note (Signed)
History and Physical Interval Note:  05/08/2011 11:22 AM  Breanna Campbell  has presented today for surgery, with the diagnosis of uterine fibroids menometrorrhagia anemia secondary to blood loss  The various methods of treatment have been discussed with the patient and family. After consideration of risks, benefits and other options for treatment, the patient has consented to  Procedure(s) (LRB): HYSTERECTOMY SUPRACERVICAL ABDOMINAL (N/A) as a surgical intervention .  The patients' history has been reviewed, patient examined, no change in status, stable for surgery.  I have reviewed the patients' chart and labs.  Questions were answered to the patient's satisfaction.   She did not take a bowel prep last night, but had a loose BM this morning .  She is NPO.  Tilda Burrow

## 2011-05-09 LAB — BASIC METABOLIC PANEL
BUN: 6 mg/dL (ref 6–23)
Calcium: 8.4 mg/dL (ref 8.4–10.5)
Chloride: 99 mEq/L (ref 96–112)
Creatinine, Ser: 0.65 mg/dL (ref 0.50–1.10)
GFR calc Af Amer: >90 mL/min (ref >90)
GFR calc non Af Amer: >90 mL/min (ref >90)

## 2011-05-09 LAB — CBC
HCT: 27.6 % — ABNORMAL LOW (ref 36.0–46.0)
MCHC: 33.3 g/dL (ref 30.0–36.0)
MCV: 86 fL (ref 78.0–100.0)
Platelets: 279 10*3/uL (ref 150–400)
RDW: 13 % (ref 11.5–15.5)
WBC: 11.3 10*3/uL — ABNORMAL HIGH (ref 4.0–10.5)

## 2011-05-09 NOTE — Progress Notes (Signed)
Pt1 Day Post-Op Procedure(s) (LRB): HYSTERECTOMY SUPRACERVICAL ABDOMINAL (N/A)  Subjective: Patient reports incisional pain, tolerating PO and no problems voiding.    Objective: I have reviewed patient's vital signs and labs.  General: alert, cooperative and no distress GI: soft, non-tender; bowel sounds normal; no masses,  no organomegaly and incision: clean, dry and intact  Assessment: s/p Procedure(s) (LRB): HYSTERECTOMY SUPRACERVICAL ABDOMINAL (N/A): stable  Plan: Advance diet Discontinue IV fluids, discontinue PCA  LOS: 1 day    Daleon Willinger V 05/09/2011, 10:19 AM

## 2011-05-09 NOTE — Progress Notes (Signed)
Patient ambulated in hallway assisted by nurse and significant other. Ambulation was tolerated well.

## 2011-05-09 NOTE — Progress Notes (Signed)
Patient voided @ 6:45 this am.

## 2011-05-09 NOTE — Addendum Note (Signed)
Addendum  created 05/09/11 1128 by Marolyn Hammock, CRNA   Modules edited:Notes Section

## 2011-05-09 NOTE — Anesthesia Postprocedure Evaluation (Signed)
  Anesthesia Post-op Note  Patient: Breanna Campbell  Procedure(s) Performed: Procedure(s) (LRB): HYSTERECTOMY SUPRACERVICAL ABDOMINAL (N/A)  Patient Location : Room 322  Anesthesia Type: General  Level of Consciousness: awake, alert , oriented and patient cooperative  Airway and Oxygen Therapy: Patient Spontanous Breathing  Post-op Pain: mild  Post-op Assessment: Post-op Vital signs reviewed, Patient's Cardiovascular Status Stable, Respiratory Function Stable, Patent Airway, Adequate PO intake and Pain level controlled  Post-op Vital Signs: Reviewed and stable  Complications: No apparent anesthesia complications

## 2011-05-10 ENCOUNTER — Encounter (HOSPITAL_COMMUNITY): Payer: Self-pay | Admitting: Obstetrics and Gynecology

## 2011-05-10 MED ORDER — OXYCODONE-ACETAMINOPHEN 5-325 MG PO TABS: 1.0000 | ORAL_TABLET | ORAL | Status: AC | PRN

## 2011-05-10 NOTE — Discharge Summary (Signed)
Physician Discharge Summary  Patient ID: TERRILL WAUTERS MRN: [id number] DOB/AGE: [birth year] 44 y.o.  Admit date: 05/08/2011 Discharge date: 05/10/2011  Admission Diagnoses: Fibroid uterus menorrhagia anemia  Discharge Diagnoses: Same Principal Problem:  *Fibroid uterus   Discharged Condition: good  Hospital Course: This 46 year old female was admitted for abdominal hysterectomy through a Pfannenstiel incision hematocrit 31%. Procedure was uneventful and performed with cervix left in place. Tubes and ovaries were normal. She has good pain management control on Dilaudid PCA pump, remained afebrile and had postoperative hematocrit of 27% she  described the discomfort as minimal on postop day 2 stable for discharge on oral Percocet. Pain stool softener of choice, limited limited lifting with followup in one week [facility] office  Consults: None  Significant Diagnostic Studies: labs:  Hemoglobin & Hematocrit     Component Value Date/Time   HGB 9.2* 05/09/2011 0527   HCT 27.6* 05/09/2011 0527     Treatments: surgery: Abdominal supracervical hysterectomy  Discharge Exam: Blood pressure 103/71, pulse 111, temperature 98.6 F (37 C), temperature source Oral, resp. rate 20, height 5\' 4"  (1.626 m), weight 78.2 kg (172 lb 6.4 oz), SpO2 98.00%. General appearance: alert and no distress GI: soft, non-tender; bowel sounds normal; no masses,  no organomegaly and Incision clean Steri-Strips in place subcuticular closure performed. Extremities: extremities normal, atraumatic, no cyanosis or edema  Disposition: Final discharge disposition not confirmed final pathology pending patient discharged to home care by herself and family  Discharge Orders    Future Orders Please Complete By Expires   Diet - low sodium heart healthy      Increase activity slowly      Discharge instructions      Comments:   General Gynecological Post-Operative Instructions You may expect to feel dizzy, weak, and  drowsy for as long as 24 hours after receiving the medicine that made you sleep (anesthetic). The following information pertains to your recovery period for the first 24 hours following surgery.  Do not drive a car, ride a bicycle, participate in physical activities, or take public transportation until you are done taking narcotic pain medicines or as directed by your caregiver.  Do not drink alcohol or take tranquilizers.  Do not take medicine that has not been prescribed by your caregiver.  Do not sign important papers or make important decisions while on narcotic pain medicines.  Have a responsible person with you.  CARE OF INCISION  Keep incision clean and dry. Take showers instead of baths until your caregiver gives you permission to take baths(0ne week) Avoid heavy lifting (more than 10 pounds/4.5 kilograms), pushing, or pulling.  Avoid activities that may risk injury to your surgical site.  Only take over-the-counter or prescription medicines for pain, discomfort, or fever as directed by your caregiver. Do not take aspirin. It can make you bleed. Take medicines (antibiotics) that kill germs as directed.  Call the office or go to the MAU if:  You feel sick to your stomach (nauseous).  You start to throw up (vomit).  You have trouble eating or drinking.  You have an oral temperature above 100.4.  You have constipation that is not helped by adjusting diet or increasing fluid intake. Pain medicines are a common cause of constipation.  SEEK IMMEDIATE MEDICAL CARE IF:  You have persistent dizziness.  You have difficulty breathing or a congested sounding (croupy) cough.  You have an oral temperature above 102.5, not controlled by medicine.  There is increasing pain or tenderness near  or in the surgical site.  ExitCare Patient Information 2011 [place], [place].   Lifting restrictions      Comments:   Do not lift anything that requires you to lock your breath in order to lift, x 4weeks    Sexual Activity Restrictions      Comments:   None x4 weeks   No wound care      Call MD for:  temperature >100.4      Call MD for:  persistant nausea and vomiting      Call MD for:  severe uncontrolled pain      Call MD for:  redness, tenderness, or signs of infection (pain, swelling, redness, odor or green/yellow discharge around incision site)        Medication List  As of 05/10/2011 10:48 AM   TAKE these medications         oxyCODONE-acetaminophen 5-325 MG per tablet   Commonly known as: PERCOCET   Take 1-2 tablets by mouth every 3 (three) hours as needed (moderate to severe pain (when tolerating fluids)).           Follow-up Information    Follow up with Tilda Burrow, MD. Schedule an appointment as soon as possible for a visit in 1 week. (Or earlier as needed if symptoms worsen)    Contact information:   [facility] [address], [address] [place] [address] [phone]          Signed: Tilda Burrow 05/10/2011, 10:48 AM

## 2011-05-10 NOTE — Progress Notes (Signed)
Writer discussed and reviewed discharge instructions, verbalized understanding.  Encouraged to call with any questions that may arise.  Pt took all belongings and staff member wheel chaired out in stable condition. Encouraged to follow up with appt's, verbalized understanding.

## 2011-05-10 NOTE — Progress Notes (Signed)
2 Days Post-Op Procedure(s) (LRB): HYSTERECTOMY SUPRACERVICAL ABDOMINAL (N/A)  Subjective: Patient reports tolerating PO and + flatus.    Objective: I have reviewed patient's vital signs, intake and output and labs.  General: alert, cooperative and no distress GI: soft, non-tender; bowel sounds normal; no masses,  no organomegaly and incision: clean, dry and intact Extremities: extremities normal, atraumatic, no cyanosis or edema  Assessment: s/p Procedure(s) (LRB): HYSTERECTOMY SUPRACERVICAL ABDOMINAL (N/A): stable, progressing well and tolerating diet  Plan: Advance diet Discharge home  LOS: 2 days    Breanna Campbell V 05/10/2011, 10:40 AM

## 2011-05-14 NOTE — Progress Notes (Signed)
Utilization review completed.  

## 2011-05-30 ENCOUNTER — Other Ambulatory Visit (HOSPITAL_COMMUNITY): Payer: 59

## 2011-10-15 ENCOUNTER — Other Ambulatory Visit: Payer: Self-pay | Admitting: Family Medicine

## 2011-10-15 DIAGNOSIS — Z139 Encounter for screening, unspecified: Secondary | ICD-10-CM

## 2011-10-23 ENCOUNTER — Ambulatory Visit (INDEPENDENT_AMBULATORY_CARE_PROVIDER_SITE_OTHER): Payer: 59 | Admitting: Family Medicine

## 2011-10-23 ENCOUNTER — Encounter: Payer: Self-pay | Admitting: Family Medicine

## 2011-10-23 VITALS — BP 114/80 | HR 65 | Resp 15 | Ht 64.25 in | Wt 152.8 lb

## 2011-10-23 DIAGNOSIS — K5900 Constipation, unspecified: Secondary | ICD-10-CM

## 2011-10-23 DIAGNOSIS — D649 Anemia, unspecified: Secondary | ICD-10-CM

## 2011-10-23 DIAGNOSIS — Z23 Encounter for immunization: Secondary | ICD-10-CM

## 2011-10-23 LAB — CBC
MCHC: 32.9 g/dL (ref 30.0–36.0)
Platelets: 358 10*3/uL (ref 150–400)
RDW: 13.4 % (ref 11.5–15.5)
WBC: 5.2 10*3/uL (ref 4.0–10.5)

## 2011-10-23 MED ORDER — POLYETHYLENE GLYCOL 3350 17 GM/SCOOP PO POWD: 17.0000 g | Freq: Every day | ORAL | Status: DC

## 2011-10-23 NOTE — Patient Instructions (Addendum)
Get the labs drawn Mammogram for Thursday Miralax once a day for bowel, you can increase to twice a day if needed  F/u March for physical   Constipation, Adult Constipation is when a person:  Poops (bowel movement) less than 3 times a week.   Has a hard time pooping.   Has poop that is dry, hard, or bigger than normal.  HOME CARE    Eat more fiber, such as fruits, vegetables, whole grains like brown rice, and beans.   Eat less fatty foods and sugar. This includes [place] fries, hamburgers, cookies, candy, and soda.   If you are not getting enough fiber from food, take products with added fiber in them (supplements).   Drink enough fluid to keep your pee (urine) clear or pale yellow.   Go to the restroom when you feel like you need to poop. Do not hold it.   Only take medicine as told by your doctor. Do not take medicines that help you poop (laxatives) without talking to your doctor first.   Exercise on a regular basis, or as told by your doctor.  GET HELP RIGHT AWAY IF:    You have bright red blood in your poop (stool).   Your constipation lasts more than 4 days or gets worse.   You have belly (abdomen) or butt (rectal) pain.   You have thin poop (as thin as a pencil).   You lose weight, and it cannot be explained.  MAKE SURE YOU:    Understand these instructions.   Will watch your condition.   Will get help right away if you are not doing well or get worse.  Document Released: 06/13/2007 Document Revised: 03/19/2011 Document Reviewed: 11/28/2010 [facility] Patient Information 2013 [place], [place].

## 2011-10-24 ENCOUNTER — Encounter: Payer: Self-pay | Admitting: Family Medicine

## 2011-10-24 DIAGNOSIS — D649 Anemia, unspecified: Secondary | ICD-10-CM | POA: Insufficient documentation

## 2011-10-24 DIAGNOSIS — Z23 Encounter for immunization: Secondary | ICD-10-CM

## 2011-10-24 DIAGNOSIS — K5900 Constipation, unspecified: Secondary | ICD-10-CM | POA: Insufficient documentation

## 2011-10-24 NOTE — Assessment & Plan Note (Signed)
Trial of miralax, given handout on diet, fiber rich foods

## 2011-10-24 NOTE — Progress Notes (Signed)
  Subjective:    Patient ID: Breanna Campbell, female    DOB: 07/01/1965, 46 y.o.   MRN: [id number]  HPI  Constipation on and off for past few months, has used suppository and stool softners but stool softeners are not helping much. Denies blood in stool, abdominal pain. Was very active with recent job that just ended and lost some weight.  S/P hysterectomy for fibroid uterus and heavy bleeding leading to anemia    Review of Systems  GEN- denies fatigue, fever, weight loss,weakness, recent illness HEENT- denies eye drainage, change in vision, nasal discharge, CVS- denies chest pain, palpitations RESP- denies SOB, cough, wheeze ABD- denies N/V, +change in stools, abd pain GU- denies dysuria, hematuria, dribbling, incontinence MSK- denies joint pain, muscle aches, injury Neuro- denies headache, dizziness, syncope, seizure activity      Objective:   Physical Exam GEN- NAD, alert and oriented x3 HEENT- PERRL, EOMI, non injected sclera, pink conjunctiva, MMM, oropharynx clear CVS- RRR, no murmur RESP-CTAB ABD-NABS,soft,NT,ND EXT- No edema Pulses- Radial, DP- 2+        Assessment & Plan:

## 2011-10-24 NOTE — Assessment & Plan Note (Signed)
Chronic anemia with her previous uterine bleeding, CBC rechecked and much improved, no supplements needed, daily vitamin

## 2011-10-25 ENCOUNTER — Ambulatory Visit (HOSPITAL_COMMUNITY)
Admission: RE | Admit: 2011-10-25 | Discharge: 2011-10-25 | Disposition: A | Discharge status: Home / Self Care | Payer: 59 | Source: Ambulatory Visit | Attending: Family Medicine | Admitting: Family Medicine

## 2011-10-25 DIAGNOSIS — Z1231 Encounter for screening mammogram for malignant neoplasm of breast: Secondary | ICD-10-CM | POA: Insufficient documentation

## 2011-10-25 DIAGNOSIS — Z139 Encounter for screening, unspecified: Secondary | ICD-10-CM

## 2011-10-30 ENCOUNTER — Other Ambulatory Visit: Payer: Self-pay | Admitting: Family Medicine

## 2011-10-30 DIAGNOSIS — R928 Other abnormal and inconclusive findings on diagnostic imaging of breast: Secondary | ICD-10-CM

## 2011-11-14 ENCOUNTER — Other Ambulatory Visit (HOSPITAL_COMMUNITY): Payer: Self-pay | Admitting: Family Medicine

## 2011-11-14 ENCOUNTER — Other Ambulatory Visit: Payer: Self-pay | Admitting: Family Medicine

## 2011-11-14 ENCOUNTER — Ambulatory Visit (HOSPITAL_COMMUNITY)
Admission: RE | Admit: 2011-11-14 | Discharge: 2011-11-14 | Disposition: A | Discharge status: Home / Self Care | Payer: 59 | Source: Ambulatory Visit | Attending: Family Medicine | Admitting: Family Medicine

## 2011-11-14 DIAGNOSIS — R928 Other abnormal and inconclusive findings on diagnostic imaging of breast: Secondary | ICD-10-CM | POA: Insufficient documentation

## 2012-03-25 ENCOUNTER — Encounter: Payer: Self-pay | Admitting: Family Medicine

## 2012-03-25 ENCOUNTER — Other Ambulatory Visit (HOSPITAL_COMMUNITY)
Admission: RE | Admit: 2012-03-25 | Discharge: 2012-03-25 | Disposition: A | Discharge status: Home / Self Care | Payer: 59 | Source: Ambulatory Visit | Attending: Family Medicine | Admitting: Family Medicine

## 2012-03-25 ENCOUNTER — Ambulatory Visit (INDEPENDENT_AMBULATORY_CARE_PROVIDER_SITE_OTHER): Payer: 59 | Admitting: Family Medicine

## 2012-03-25 VITALS — BP 124/84 | HR 79 | Resp 16 | Wt 160.4 lb

## 2012-03-25 DIAGNOSIS — Z0000 Encounter for general adult medical examination without abnormal findings: Secondary | ICD-10-CM

## 2012-03-25 DIAGNOSIS — N760 Acute vaginitis: Secondary | ICD-10-CM

## 2012-03-25 DIAGNOSIS — N939 Abnormal uterine and vaginal bleeding, unspecified: Secondary | ICD-10-CM | POA: Insufficient documentation

## 2012-03-25 DIAGNOSIS — N898 Other specified noninflammatory disorders of vagina: Secondary | ICD-10-CM

## 2012-03-25 DIAGNOSIS — Z1321 Encounter for screening for nutritional disorder: Secondary | ICD-10-CM

## 2012-03-25 DIAGNOSIS — Z1329 Encounter for screening for other suspected endocrine disorder: Secondary | ICD-10-CM

## 2012-03-25 DIAGNOSIS — Z113 Encounter for screening for infections with a predominantly sexual mode of transmission: Secondary | ICD-10-CM | POA: Insufficient documentation

## 2012-03-25 DIAGNOSIS — Z130 Encounter for screening for diseases of the blood and blood-forming organs and certain disorders involving the immune mechanism: Secondary | ICD-10-CM

## 2012-03-25 DIAGNOSIS — Z124 Encounter for screening for malignant neoplasm of cervix: Secondary | ICD-10-CM

## 2012-03-25 DIAGNOSIS — Z01419 Encounter for gynecological examination (general) (routine) without abnormal findings: Secondary | ICD-10-CM

## 2012-03-25 DIAGNOSIS — Z13220 Encounter for screening for lipoid disorders: Secondary | ICD-10-CM

## 2012-03-25 LAB — CBC
HCT: 36.4 % (ref 36.0–46.0)
RBC: 4.23 MIL/uL (ref 3.87–5.11)
RDW: 13.1 % (ref 11.5–15.5)
WBC: 5.3 10*3/uL (ref 4.0–10.5)

## 2012-03-25 LAB — LIPID PANEL
Cholesterol: 180 mg/dL (ref 0–200)
HDL: 45 mg/dL (ref >39)
LDL Cholesterol: 115 mg/dL — ABNORMAL HIGH (ref 0–99)
Triglycerides: 100 mg/dL (ref <150)

## 2012-03-25 LAB — COMPREHENSIVE METABOLIC PANEL
ALT: <8 U/L (ref 0–35)
BUN: 11 mg/dL (ref 6–23)
CO2: 23 mEq/L (ref 19–32)
Calcium: 9.5 mg/dL (ref 8.4–10.5)
Chloride: 104 mEq/L (ref 96–112)
Creat: 0.73 mg/dL (ref 0.50–1.10)
Glucose, Bld: 90 mg/dL (ref 70–99)
Total Bilirubin: 0.4 mg/dL (ref 0.3–1.2)

## 2012-03-25 NOTE — Assessment & Plan Note (Addendum)
Physical done with GYN exam. Will send her for fasting labs.  PAP Smear as supracervical Hysterectomy

## 2012-03-25 NOTE — Progress Notes (Signed)
  Subjective:    Patient ID: Breanna Campbell, female    DOB: 09/26/1965, 47 y.o.   MRN: [id number]  HPI Patient presents for physical exam. She is status post supracervical hysterectomy. Mammogram is up-to-date. She did have an episode of spotting last week after having a headache but this is now resolved. She's had mild discharge no itching.   Review of Systems - per above  GEN- denies fatigue, fever, weight loss,weakness, recent illness HEENT- denies eye drainage, change in vision, nasal discharge, CVS- denies chest pain, palpitations RESP- denies SOB, cough, wheeze ABD- denies N/V, change in stools, abd pain GU- denies dysuria, hematuria, dribbling, incontinence MSK- denies joint pain, muscle aches, injury Neuro- denies headache, dizziness, syncope, seizure activity      Objective:   Physical Exam GEN- NAD, alert and oriented x3 HEENT- PERRL, EOMI, non injected sclera, pink conjunctiva, MMM, oropharynx clear Neck- Supple, no thyromegaly CVS- RRR, no murmur RESP-CTAB EXT- No edema Pulses- Radial, DP- 2+ Breast- normal symmetry, no nipple inversion,no nipple drainage, no nodules or lumps felt Nodes- no axillary nodes GU- normal external genitalia, vaginal mucosa pink and moist, cervix visualized no growth, +blood form os after PAP Brush used, minimal thin clear discharge, no uterus felt, ovaries not palpable        Assessment & Plan:

## 2012-03-25 NOTE — Assessment & Plan Note (Signed)
She's had a hysterectomy however this was supracervical. I have obtain cultures and a Pap smear on her. She only had that one episode of spotting I see no overwhelming source of bleeding currently. If she has a repeat episode we'll get her to GYN as soon as possible.

## 2012-03-25 NOTE — Patient Instructions (Addendum)
PAP Smear done We will call with results  Get the labs done fasting Tetanus Booster Mammogram due in November Recommend Eye exam yearly Recommend Dental visit every 6 months F/U as needed or in 1 year

## 2012-03-26 ENCOUNTER — Telehealth: Payer: Self-pay | Admitting: Family Medicine

## 2012-03-26 NOTE — Telephone Encounter (Signed)
Results not back yet

## 2012-03-27 NOTE — Telephone Encounter (Signed)
Patient aware of results and that she will get a return call if she needs vitamin D.

## 2012-04-01 MED ORDER — ERGOCALCIFEROL 1.25 MG (50000 UT) PO CAPS: 50000.0000 [IU] | ORAL_CAPSULE | ORAL | Status: DC

## 2012-04-01 NOTE — Addendum Note (Signed)
Addended by: Milinda Antis F on: 04/01/2012 05:26 PM   Modules accepted: Orders

## 2012-12-22 ENCOUNTER — Other Ambulatory Visit: Payer: Self-pay | Admitting: Family Medicine

## 2012-12-22 ENCOUNTER — Other Ambulatory Visit: Payer: 59

## 2012-12-22 DIAGNOSIS — R634 Abnormal weight loss: Secondary | ICD-10-CM

## 2012-12-22 DIAGNOSIS — R109 Unspecified abdominal pain: Secondary | ICD-10-CM

## 2012-12-22 DIAGNOSIS — E559 Vitamin D deficiency, unspecified: Secondary | ICD-10-CM

## 2012-12-22 DIAGNOSIS — K5900 Constipation, unspecified: Secondary | ICD-10-CM

## 2012-12-22 DIAGNOSIS — E785 Hyperlipidemia, unspecified: Secondary | ICD-10-CM

## 2012-12-22 DIAGNOSIS — Z79899 Other long term (current) drug therapy: Secondary | ICD-10-CM

## 2012-12-22 DIAGNOSIS — Z7983 Long term (current) use of bisphosphonates: Secondary | ICD-10-CM

## 2012-12-23 ENCOUNTER — Ambulatory Visit (INDEPENDENT_AMBULATORY_CARE_PROVIDER_SITE_OTHER): Payer: 59 | Admitting: Family Medicine

## 2012-12-23 ENCOUNTER — Encounter: Payer: Self-pay | Admitting: Family Medicine

## 2012-12-23 VITALS — BP 130/80 | HR 78 | Temp 98.6°F | Resp 18 | Ht 64.0 in | Wt 140.0 lb

## 2012-12-23 DIAGNOSIS — R634 Abnormal weight loss: Secondary | ICD-10-CM

## 2012-12-23 DIAGNOSIS — K5900 Constipation, unspecified: Secondary | ICD-10-CM

## 2012-12-23 DIAGNOSIS — Z23 Encounter for immunization: Secondary | ICD-10-CM

## 2012-12-23 DIAGNOSIS — R630 Anorexia: Secondary | ICD-10-CM

## 2012-12-23 LAB — COMPREHENSIVE METABOLIC PANEL
ALT: 8 U/L (ref 0–35)
AST: 14 U/L (ref 0–37)
Albumin: 4.2 g/dL (ref 3.5–5.2)
Calcium: 9.6 mg/dL (ref 8.4–10.5)
Chloride: 102 mEq/L (ref 96–112)
Potassium: 3.9 mEq/L (ref 3.5–5.3)
Sodium: 133 mEq/L — ABNORMAL LOW (ref 135–145)
Total Protein: 7.7 g/dL (ref 6.0–8.3)

## 2012-12-23 LAB — CBC WITH DIFFERENTIAL/PLATELET
Basophils Absolute: 0 10*3/uL (ref 0.0–0.1)
HCT: 33.6 % — ABNORMAL LOW (ref 36.0–46.0)
Lymphocytes Relative: 47 % — ABNORMAL HIGH (ref 12–46)
MCHC: 33.6 g/dL (ref 30.0–36.0)
Neutro Abs: 2.8 10*3/uL (ref 1.7–7.7)
Neutrophils Relative %: 42 % — ABNORMAL LOW (ref 43–77)
Platelets: 288 10*3/uL (ref 150–400)
RDW: 13.1 % (ref 11.5–15.5)
WBC: 6.5 10*3/uL (ref 4.0–10.5)

## 2012-12-23 LAB — IRON AND TIBC: UIBC: 223 ug/dL (ref 125–400)

## 2012-12-23 MED ORDER — POLYETHYLENE GLYCOL 3350 17 GM/SCOOP PO POWD: 17.0000 g | Freq: Every day | ORAL | Status: DC

## 2012-12-23 NOTE — Assessment & Plan Note (Signed)
She is here with at least a 20 pound loss of weight in the past 9 months. Her family history consists of thyroid problems that her mother as well as diverticulitis. There is no cancer history in the family. She is a nonsmoker. Her Pap smear was normal in she's had supracervical hysterectomy. She's not given any other systemic findings with exception of the change in bowels. All check her fasting glucose as well as her metabolic panel. TSH will also be done. We may need imaging of her abdomen and will await blood work first

## 2012-12-23 NOTE — Patient Instructions (Signed)
Flu shot given Labs we will call you with results Start miralax  1 capfull once a day for the constipation Eat three times a day  F/U 4 weeks

## 2012-12-23 NOTE — Assessment & Plan Note (Signed)
Per above encouraged small meals

## 2012-12-23 NOTE — Progress Notes (Signed)
   Subjective:    Patient ID: Breanna Campbell, female    DOB: 1965/11/15, 47 y.o.   MRN: [id number]  HPI  Patient schedule for physical exam however she had a complete physical back in March 2014. She is here secondary to weight loss she's not quite sure how much weight loss but her mothers here today this is that she has dropped significantly since September. Her last weight that we have record is 160 pounds back in March 2014 she is down 20 pounds today. She denies any intentional weight loss. Her appetite is also decreased significantly. She typically eats once or maybe twice a day.  She states that she typically wears them in size 34 and is down to 32 . She's also had difficulty with her bowels. She's had severe constipation where she can no over week without having a bowel movement. She use MiraLAX one time as well as some type of probiotic but otherwise has not used anything   Review of Systems   GEN- denies fatigue, fever, +weight loss,weakness, recent illness HEENT- denies eye drainage, change in vision, nasal discharge, CVS- denies chest pain, palpitations RESP- denies SOB, cough, wheeze ABD- denies N/V, +change in stools, abd pain, no blood in stool GU- denies dysuria, hematuria, dribbling, incontinence MSK- denies joint pain, muscle aches, injury Neuro- denies headache, dizziness, syncope, seizure activity      Objective:   Physical Exam GEN- NAD, alert and oriented x3 HEENT- PERRL, EOMI, non injected sclera, pink conjunctiva, MMM, oropharynx clear Neck- Supple, no thyromegaly CVS- RRR, no murmur RESP-CTAB ABD-NABS,soft,NT,ND EXT- No edema Pulses- Radial, DP- 2+        Assessment & Plan:

## 2012-12-23 NOTE — Assessment & Plan Note (Signed)
Worsening constipation. I will put her on MiraLAX once a day. Her lab work will be done.

## 2012-12-24 MED ORDER — ERGOCALCIFEROL 1.25 MG (50000 UT) PO CAPS: 50000.0000 [IU] | ORAL_CAPSULE | ORAL | Status: DC

## 2012-12-24 NOTE — Addendum Note (Signed)
Addended by: Milinda Antis F on: 12/24/2012 01:51 PM   Modules accepted: Orders

## 2012-12-24 NOTE — Progress Notes (Signed)
LMTRC

## 2012-12-26 ENCOUNTER — Telehealth: Payer: Self-pay | Admitting: Family Medicine

## 2012-12-26 NOTE — Telephone Encounter (Signed)
Called pt and mother Darel Hong is aware of lab results

## 2012-12-26 NOTE — Telephone Encounter (Signed)
PT is returning your call Call back number is [phone]

## 2013-01-09 ENCOUNTER — Ambulatory Visit
Admission: RE | Admit: 2013-01-09 | Discharge: 2013-01-09 | Disposition: A | Discharge status: Home / Self Care | Payer: BC Managed Care – PPO | Source: Ambulatory Visit | Attending: Family Medicine | Admitting: Family Medicine

## 2013-01-09 DIAGNOSIS — K5900 Constipation, unspecified: Secondary | ICD-10-CM

## 2013-01-09 DIAGNOSIS — R634 Abnormal weight loss: Secondary | ICD-10-CM

## 2013-01-09 DIAGNOSIS — R109 Unspecified abdominal pain: Secondary | ICD-10-CM

## 2013-01-09 MED ORDER — IOHEXOL 300 MG/ML  SOLN
100.0000 mL | Freq: Once | INTRAMUSCULAR | Status: AC | PRN
  Administered 2013-01-09: 100 mL via INTRAVENOUS

## 2013-01-13 ENCOUNTER — Telehealth: Payer: Self-pay | Admitting: Family Medicine

## 2013-01-13 MED ORDER — POLYETHYLENE GLYCOL 3350 17 GM/SCOOP PO POWD: 17.0000 g | Freq: Every day | ORAL | Status: DC

## 2013-01-13 NOTE — Telephone Encounter (Signed)
Medication refilled per protocol. 

## 2013-01-23 ENCOUNTER — Ambulatory Visit (INDEPENDENT_AMBULATORY_CARE_PROVIDER_SITE_OTHER): Payer: BC Managed Care – PPO | Admitting: Family Medicine

## 2013-01-23 VITALS — BP 114/80 | HR 78 | Temp 98.4°F | Resp 18 | Ht 62.5 in | Wt 139.0 lb

## 2013-01-23 DIAGNOSIS — K8020 Calculus of gallbladder without cholecystitis without obstruction: Secondary | ICD-10-CM

## 2013-01-23 DIAGNOSIS — R634 Abnormal weight loss: Secondary | ICD-10-CM

## 2013-01-23 DIAGNOSIS — K5900 Constipation, unspecified: Secondary | ICD-10-CM

## 2013-01-23 DIAGNOSIS — Z23 Encounter for immunization: Secondary | ICD-10-CM

## 2013-01-23 DIAGNOSIS — E559 Vitamin D deficiency, unspecified: Secondary | ICD-10-CM

## 2013-01-23 NOTE — Patient Instructions (Addendum)
Your CT shows constipation and gallstones Your labs look good, except the vitamin D Increase your meals - need to eat at least 3 times a day- drink Boost or carnation instant breakfast in the morning  Multivitamin with iron once a day  Tetantus Booster F/U 6 weeks  Cholelithiasis Cholelithiasis (also called gallstones) is a form of gallbladder disease in which gallstones form in your gallbladder. The gallbladder is an organ that stores bile made in the liver, which helps digest fats. Gallstones begin as small crystals and slowly grow into stones. Gallstone pain occurs when the gallbladder spasms and a gallstone is blocking the duct. Pain can also occur when a stone passes out of the duct.  RISK FACTORS  Being female.   Having multiple pregnancies. Health care providers sometimes advise removing diseased gallbladders before future pregnancies.   Being obese.  Eating a diet heavy in fried foods and fat.   Being older than 64 years and increasing age.   Prolonged use of medicines containing female hormones.   Having diabetes mellitus.   Rapidly losing weight.   Having a family history of gallstones (heredity).  SYMPTOMS  Nausea.   Vomiting.  Abdominal pain.   Yellowing of the skin (jaundice).   Sudden pain. It may persist from several minutes to several hours.  Fever.   Tenderness to the touch. In some cases, when gallstones do not move into the bile duct, people have no pain or symptoms. These are called "silent" gallstones.  TREATMENT Silent gallstones do not need treatment. In severe cases, emergency surgery may be required. Options for treatment include:  Surgery to remove the gallbladder. This is the most common treatment.  Medicines. These do not always work and may take 6 12 months or more to work.  Shock wave treatment (extracorporeal biliary lithotripsy). In this treatment an ultrasound machine sends shock waves to the gallbladder to break  gallstones into smaller pieces that can pass into the intestines or be dissolved by medicine. HOME CARE INSTRUCTIONS   Only take over-the-counter or prescription medicines for pain, discomfort, or fever as directed by your health care provider.   Follow a low-fat diet until seen again by your health care provider. Fat causes the gallbladder to contract, which can result in pain.   Follow up with your health care provider as directed. Attacks are almost always recurrent and surgery is usually required for permanent treatment.  SEEK IMMEDIATE MEDICAL CARE IF:   Your pain increases and is not controlled by medicines.   You have a fever or persistent symptoms for more than 2 3 days.   You have a fever and your symptoms suddenly get worse.   You have persistent nausea and vomiting.  MAKE SURE YOU:   Understand these instructions.  Will watch your condition.  Will get help right away if you are not doing well or get worse. Document Released: 12/21/2004 Document Revised: 08/27/2012 Document Reviewed: 06/18/2012 [facility] Patient Information 2014 [place].

## 2013-01-23 NOTE — Progress Notes (Signed)
   Subjective:    Patient ID: Breanna Campbell, female    DOB: 1965-09-08, 48 y.o.   MRN: [id number]  HPI  Pt here to f/u labs and CT scan. Was seen 4 weeks ago due to abnormal weight loss and constipation. BM improved with miralax. CT Scan showed gallstones and constipation, no acute pathology No concerns today Only eats 1 true meal a day and snacks during the morning, she is very active on her job    Review of Systems  GEN- denies fatigue, fever, weight loss,weakness, recent illness HEENT- denies eye drainage, change in vision, nasal discharge, CVS- denies chest pain, palpitations RESP- denies SOB, cough, wheeze ABD- denies N/V, change in stools, abd pain Neuro- denies headache, dizziness, syncope, seizure activity      Objective:   Physical Exam GEN- NAD, alert and oriented x3 HEENT- PERRL, EOMI, non injected sclera, pink conjunctiva, MMM, oropharynx clear CVS- RRR, no murmur RESP-CTAB ABD-NABS,soft,NT,ND EXT- No edema Pulses- Radial 2+        Assessment & Plan:

## 2013-01-25 ENCOUNTER — Encounter: Payer: Self-pay | Admitting: Family Medicine

## 2013-01-25 DIAGNOSIS — K8020 Calculus of gallbladder without cholecystitis without obstruction: Secondary | ICD-10-CM | POA: Insufficient documentation

## 2013-01-25 DIAGNOSIS — E559 Vitamin D deficiency, unspecified: Secondary | ICD-10-CM | POA: Insufficient documentation

## 2013-01-25 NOTE — Assessment & Plan Note (Signed)
Asymptomatic gallstones at this time Discussed when surgery is indicated

## 2013-01-25 NOTE — Assessment & Plan Note (Signed)
Work up thus far negative Need to improve nutrition, increase meals add supplments She is not underweight

## 2013-01-25 NOTE — Assessment & Plan Note (Signed)
Improved with Miralax

## 2013-01-25 NOTE — Assessment & Plan Note (Signed)
12 week treatment

## 2013-03-06 ENCOUNTER — Encounter: Payer: Self-pay | Admitting: Family Medicine

## 2013-03-06 ENCOUNTER — Ambulatory Visit (INDEPENDENT_AMBULATORY_CARE_PROVIDER_SITE_OTHER): Payer: BC Managed Care – PPO | Admitting: Family Medicine

## 2013-03-06 VITALS — BP 142/82 | HR 88 | Temp 97.8°F | Resp 20 | Ht 62.25 in | Wt 139.0 lb

## 2013-03-06 DIAGNOSIS — R109 Unspecified abdominal pain: Secondary | ICD-10-CM | POA: Insufficient documentation

## 2013-03-06 DIAGNOSIS — K5900 Constipation, unspecified: Secondary | ICD-10-CM

## 2013-03-06 NOTE — Progress Notes (Signed)
Patient ID: Breanna Campbell, female   DOB: [birth year], 48 y.o.   MRN: [id number]   Subjective:    Patient ID: Breanna Campbell, female    DOB: May 13, 1965, 48 y.o.   MRN: [id number]  Patient presents for 6 week F/U and abdominal pain  she was seen about 6 weeks ago it secondary to abdominal pain and constipation as well as weight loss. Her CT scan of abdomen and pelvis was unremarkable with exception of gallstones. She's been maintaining her weight and is now eating at least 2 meals a day. She's been trying very hard on her new job to get full-time benefits. She will often hold her stool even if it causes discomfort because she does not want to use the restroom at work. She denies any pain with eating denies any nausea vomiting associated. Her abdominal pain this time started last night she did have a bowel movement last night and denies any straining but it in a few days and she had her MiraLAX.     Review Of Systems:  GEN- denies fatigue, fever, weight loss,weakness, recent illness CVS- denies chest pain, palpitations RESP- denies SOB, cough, wheeze ABD- denies N/V, change in stools,+ abd pain GU- denies dysuria, hematuria, dribbling, incontinence Neuro- denies headache, dizziness, syncope, seizure activity       Objective:    BP 142/82  Pulse 88  Temp(Src) 97.8 F (36.6 C)  Resp 20  Ht 5' 2.25" (1.581 m)  Wt 139 lb (63.05 kg)  BMI 25.22 kg/m2  LMP 04/10/2011 GEN- NAD, alert and oriented x3 HEENT- PERRL, EOMI, non injected sclera, pink conjunctiva, MMM, oropharynx clear CVS- RRR, no murmur RESP-CTAB ABD-NABS,soft,mild  TTP epigastric and lower quadrants, no rebound, no guarding,ND EXT- No edema Pulses- Radial 2+        Assessment & Plan:      Problem List Items Addressed This Visit   None      Note: This dictation was prepared with Dragon dictation along with smaller phrase technology. Any transcriptional errors that result from this process are unintentional.

## 2013-03-06 NOTE — Patient Instructions (Signed)
Start the amitiza twice a day We will call and check in , next step is Gastroenterology specialist F/U 3 months

## 2013-03-06 NOTE — Assessment & Plan Note (Signed)
Trial of amitiza 65mcg BID, recheck on Monday via phone if not better refer to GI

## 2013-03-06 NOTE — Assessment & Plan Note (Addendum)
She continues to have random episodes of abdominal pain some associated with constipation. She's not having true gallbladder symptoms and she's not having any pain related to her intake. A recent CT scan was fairly unremarkable. Her labs also did not show any pathology. Will give her Amitiza to to use for her constipation and see if this helps over the weekend. I do not have any red flags on exam. If she does not improve I will refer her to gastroenterology for further workup

## 2013-06-05 ENCOUNTER — Ambulatory Visit (INDEPENDENT_AMBULATORY_CARE_PROVIDER_SITE_OTHER): Payer: BC Managed Care – PPO | Admitting: Family Medicine

## 2013-06-05 ENCOUNTER — Encounter: Payer: Self-pay | Admitting: Family Medicine

## 2013-06-05 VITALS — BP 122/64 | HR 72 | Temp 98.1°F | Resp 14 | Ht 64.0 in | Wt 141.0 lb

## 2013-06-05 DIAGNOSIS — K5900 Constipation, unspecified: Secondary | ICD-10-CM

## 2013-06-05 DIAGNOSIS — E559 Vitamin D deficiency, unspecified: Secondary | ICD-10-CM

## 2013-06-05 DIAGNOSIS — R634 Abnormal weight loss: Secondary | ICD-10-CM

## 2013-06-05 MED ORDER — LUBIPROSTONE 24 MCG PO CAPS: 24.0000 ug | ORAL_CAPSULE | Freq: Two times a day (BID) | ORAL | Status: DC

## 2013-06-05 NOTE — Assessment & Plan Note (Signed)
She has gained some weight, she is not eating on regular basis discussed importance of this, yesterday she only age a salad, states she was not hungry and was tired from work She is taking lunch to work now which is an improvement Needs at least 2 meals a day

## 2013-06-05 NOTE — Progress Notes (Signed)
Patient ID: Breanna Campbell, female   DOB: 1965-12-01, 48 y.o.   MRN: [id number]   Subjective:    Patient ID: Breanna Campbell, female    DOB: 1965/05/06, 48 y.o.   MRN: [id number]  Patient presents for 3 month F/U  patient here to follow chronic multiple problems. She still not eating on a regular basis however has gained 2 pounds since her last visit. She ran out of her Amitiza however this was helping her constipation. She is unsure how much vitamin D she's supposed to take now and she completed the three-month course. No new problems today   Review Of Systems:  GEN- denies fatigue, fever, weight loss,weakness, recent illness HEENT- denies eye drainage, change in vision, nasal discharge, CVS- denies chest pain, palpitations RESP- denies SOB, cough, wheeze ABD- denies N/V, change in stools, abd pain GU- denies dysuria, hematuria, dribbling, incontinence MSK- denies joint pain, muscle aches, injury Neuro- denies headache, dizziness, syncope, seizure activity       Objective:    BP 122/64  Pulse 72  Temp(Src) 98.1 F (36.7 C) (Oral)  Resp 14  Ht 5\' 4"  (1.626 m)  Wt 141 lb (63.957 kg)  BMI 24.19 kg/m2  LMP 04/10/2011 GEN- NAD, alert and oriented x3 HEENT- PERRL, EOMI, non injected sclera, pink conjunctiva, MMM, oropharynx clear Neck- Supple, no thyromegaly CVS- RRR, no murmur RESP-CTAB ABD-NABS,soft,NT,ND EXT- No edema Pulses- Radial, DP- 2+        Assessment & Plan:      Problem List Items Addressed This Visit   None      Note: This dictation was prepared with Dragon dictation along with smaller phrase technology. Any transcriptional errors that result from this process are unintentional.

## 2013-06-05 NOTE — Assessment & Plan Note (Signed)
Restart Amitiza, new script sent

## 2013-06-05 NOTE — Assessment & Plan Note (Signed)
Start calcium 1200 mg vitamin D 1000 international units

## 2013-06-05 NOTE — Patient Instructions (Signed)
Calcium 1200mg  and Vitamin D 1000IU Restart the amitiza You need to eat at least 2 times a day with small snacks  F/U 6 months

## 2013-10-09 ENCOUNTER — Ambulatory Visit (INDEPENDENT_AMBULATORY_CARE_PROVIDER_SITE_OTHER): Payer: BC Managed Care – PPO | Admitting: Family Medicine

## 2013-10-09 ENCOUNTER — Encounter: Payer: Self-pay | Admitting: Family Medicine

## 2013-10-09 VITALS — BP 122/80 | HR 78 | Temp 98.5°F | Resp 20 | Ht 64.0 in | Wt 141.0 lb

## 2013-10-09 DIAGNOSIS — K8020 Calculus of gallbladder without cholecystitis without obstruction: Secondary | ICD-10-CM

## 2013-10-09 DIAGNOSIS — F411 Generalized anxiety disorder: Secondary | ICD-10-CM

## 2013-10-09 DIAGNOSIS — K5909 Other constipation: Secondary | ICD-10-CM

## 2013-10-09 DIAGNOSIS — E559 Vitamin D deficiency, unspecified: Secondary | ICD-10-CM

## 2013-10-09 DIAGNOSIS — R1084 Generalized abdominal pain: Secondary | ICD-10-CM

## 2013-10-09 LAB — COMPREHENSIVE METABOLIC PANEL
ALBUMIN: 4.2 g/dL (ref 3.5–5.2)
ALT: 10 U/L (ref 0–35)
AST: 15 U/L (ref 0–37)
Alkaline Phosphatase: 61 U/L (ref 39–117)
BUN: 12 mg/dL (ref 6–23)
CALCIUM: 9.1 mg/dL (ref 8.4–10.5)
CO2: 21 mEq/L (ref 19–32)
Chloride: 103 mEq/L (ref 96–112)
Creat: 0.78 mg/dL (ref 0.50–1.10)
Glucose, Bld: 80 mg/dL (ref 70–99)
Potassium: 4.3 mEq/L (ref 3.5–5.3)
Sodium: 135 mEq/L (ref 135–145)
TOTAL PROTEIN: 7.7 g/dL (ref 6.0–8.3)
Total Bilirubin: 0.4 mg/dL (ref 0.2–1.2)

## 2013-10-09 LAB — CBC WITH DIFFERENTIAL/PLATELET
BASOS ABS: 0.1 10*3/uL (ref 0.0–0.1)
BASOS PCT: 1 % (ref 0–1)
EOS ABS: 0.2 10*3/uL (ref 0.0–0.7)
EOS PCT: 3 % (ref 0–5)
HCT: 34.4 % — ABNORMAL LOW (ref 36.0–46.0)
Hemoglobin: 11.5 g/dL — ABNORMAL LOW (ref 12.0–15.0)
Lymphocytes Relative: 44 % (ref 12–46)
Lymphs Abs: 3 10*3/uL (ref 0.7–4.0)
MCH: 29.1 pg (ref 26.0–34.0)
MCHC: 33.4 g/dL (ref 30.0–36.0)
MCV: 87.1 fL (ref 78.0–100.0)
Monocytes Absolute: 0.5 10*3/uL (ref 0.1–1.0)
Monocytes Relative: 7 % (ref 3–12)
Neutro Abs: 3.1 10*3/uL (ref 1.7–7.7)
Neutrophils Relative %: 45 % (ref 43–77)
PLATELETS: 285 10*3/uL (ref 150–400)
RBC: 3.95 MIL/uL (ref 3.87–5.11)
RDW: 13.2 % (ref 11.5–15.5)
WBC: 6.8 10*3/uL (ref 4.0–10.5)

## 2013-10-09 LAB — LIPASE: Lipase: 19 U/L (ref 0–75)

## 2013-10-09 MED ORDER — LUBIPROSTONE 24 MCG PO CAPS: 24.0000 ug | ORAL_CAPSULE | Freq: Two times a day (BID) | ORAL | Status: DC

## 2013-10-09 MED ORDER — ESCITALOPRAM OXALATE 5 MG PO TABS: 5.0000 mg | ORAL_TABLET | Freq: Every day | ORAL | Status: DC

## 2013-10-09 NOTE — Patient Instructions (Signed)
Start Lexapro 5mg  at bedtime  We will call with lab work Amitiza twice a day for constipation  Referral to general surgeon for gallbladder removal  F/U as previous

## 2013-10-10 DIAGNOSIS — F411 Generalized anxiety disorder: Secondary | ICD-10-CM | POA: Insufficient documentation

## 2013-10-10 NOTE — Progress Notes (Signed)
Patient ID: Breanna Campbell, female   DOB: [birth year], 48 y.o.   MRN: [id number]   Subjective:    Patient ID: Breanna Campbell, female    DOB: [birth year], 48 y.o.   MRN: [id number]  Patient presents for stomach pains Pt here with recurrent abd pain, she had CT scan done a few months ago, which showed gallstones and chronic constipation. She was given miralax and Amitiza but has not used either. She continues to struggle with BM, she also has pain after eating and at times before. Abd pain is all over. Denies any vomiting, +nausea.     She also states she is under a lot of stress, her husband is with her today, her neves get bad, and she is working overtime at work and the people at work also stress her out. She does not sleep well, and needs something to help calm her down. No SI, no HI, no previous meds  Review Of Systems:  GEN- denies fatigue, fever, weight loss,weakness, recent illness HEENT- denies eye drainage, change in vision, nasal discharge, CVS- denies chest pain, palpitations RESP- denies SOB, cough, wheeze ABD- +s N/ denies V, change in stools, +abd pain GU- denies dysuria, hematuria, dribbling, incontinence MSK- denies joint pain, muscle aches, injury Neuro- denies headache, dizziness, syncope, seizure activity       Objective:    BP 122/80  Pulse 78  Temp(Src) 98.5 F (36.9 C) (Oral)  Resp 20  Ht 5\' 4"  (1.626 m)  Wt 141 lb (63.957 kg)  BMI 24.19 kg/m2  LMP 04/10/2011 GEN- NAD, alert and oriented x3 HEENT- PERRL, EOMI, non injected sclera, pink conjunctiva, MMM, oropharynx clear CVS- RRR, no murmur RESP-CTAB ABD-NABS,softmild TTP lower quadrants, neg murphys,ND Psych- normal affect and mood EXT- No edema Pulses- Radial 2+        Assessment & Plan:      Problem List Items Addressed This Visit   Vitamin D deficiency - Primary   Abdominal pain   Relevant Orders      CBC with Differential (Completed)      Comprehensive metabolic panel (Completed)   Lipase (Completed)      Note: This dictation was prepared with Dragon dictation along with smaller phrase technology. Any transcriptional errors that result from this process are unintentional.

## 2013-10-10 NOTE — Assessment & Plan Note (Signed)
Start lexapro at bedtime

## 2013-10-10 NOTE — Assessment & Plan Note (Signed)
Send to general surgery for gallbladder removal

## 2013-10-10 NOTE — Assessment & Plan Note (Signed)
Will restart amitiza she declines miralax due to powder/liquid consistency

## 2013-10-14 ENCOUNTER — Encounter: Payer: Self-pay | Admitting: *Deleted

## 2013-10-22 ENCOUNTER — Encounter (HOSPITAL_COMMUNITY): Payer: Self-pay

## 2013-10-22 ENCOUNTER — Encounter (HOSPITAL_COMMUNITY): Payer: Self-pay | Admitting: Pharmacy Technician

## 2013-10-22 ENCOUNTER — Encounter (HOSPITAL_COMMUNITY)
Admission: RE | Admit: 2013-10-22 | Discharge: 2013-10-22 | Disposition: A | Discharge status: Home / Self Care | Payer: BC Managed Care – PPO | Source: Ambulatory Visit | Attending: General Surgery | Admitting: General Surgery

## 2013-10-22 DIAGNOSIS — K8000 Calculus of gallbladder with acute cholecystitis without obstruction: Secondary | ICD-10-CM | POA: Insufficient documentation

## 2013-10-22 DIAGNOSIS — Z01812 Encounter for preprocedural laboratory examination: Secondary | ICD-10-CM | POA: Diagnosis not present

## 2013-10-22 LAB — CBC WITH DIFFERENTIAL/PLATELET
Basophils Absolute: 0 10*3/uL (ref 0.0–0.1)
Basophils Relative: 1 % (ref 0–1)
EOS PCT: 5 % (ref 0–5)
Eosinophils Absolute: 0.3 10*3/uL (ref 0.0–0.7)
HCT: 32.4 % — ABNORMAL LOW (ref 36.0–46.0)
Hemoglobin: 10.8 g/dL — ABNORMAL LOW (ref 12.0–15.0)
LYMPHS PCT: 46 % (ref 12–46)
Lymphs Abs: 3 10*3/uL (ref 0.7–4.0)
MCH: 29.2 pg (ref 26.0–34.0)
MCHC: 33.3 g/dL (ref 30.0–36.0)
MCV: 87.6 fL (ref 78.0–100.0)
Monocytes Absolute: 0.4 10*3/uL (ref 0.1–1.0)
Monocytes Relative: 7 % (ref 3–12)
NEUTROS ABS: 2.7 10*3/uL (ref 1.7–7.7)
Neutrophils Relative %: 41 % — ABNORMAL LOW (ref 43–77)
PLATELETS: 306 10*3/uL (ref 150–400)
RBC: 3.7 MIL/uL — AB (ref 3.87–5.11)
RDW: 12.3 % (ref 11.5–15.5)
WBC: 6.5 10*3/uL (ref 4.0–10.5)

## 2013-10-22 LAB — HEPATIC FUNCTION PANEL
ALBUMIN: 3.5 g/dL (ref 3.5–5.2)
ALK PHOS: 68 U/L (ref 39–117)
ALT: 9 U/L (ref 0–35)
AST: 16 U/L (ref 0–37)
BILIRUBIN TOTAL: 0.3 mg/dL (ref 0.3–1.2)
Bilirubin, Direct: <0.2 mg/dL (ref 0.0–0.3)
Total Protein: 7.4 g/dL (ref 6.0–8.3)

## 2013-10-22 LAB — BASIC METABOLIC PANEL
ANION GAP: 11 (ref 5–15)
BUN: 10 mg/dL (ref 6–23)
CALCIUM: 9 mg/dL (ref 8.4–10.5)
CO2: 26 mEq/L (ref 19–32)
Chloride: 101 mEq/L (ref 96–112)
Creatinine, Ser: 0.55 mg/dL (ref 0.50–1.10)
GFR calc Af Amer: >90 mL/min (ref >90)
GLUCOSE: 128 mg/dL — AB (ref 70–99)
POTASSIUM: 4 meq/L (ref 3.7–5.3)
SODIUM: 138 meq/L (ref 137–147)

## 2013-10-22 NOTE — Patient Instructions (Signed)
Breanna Campbell  10/22/2013   Your procedure is scheduled on:   10/26/2013  Report to [facility] at  950  AM.  Call this number if you have problems the morning of surgery: [phone]   Remember:   Do not eat food or drink liquids after midnight.   Take these medicines the morning of surgery with A SIP OF WATER:  lexapro   Do not wear jewelry, make-up or nail polish.  Do not wear lotions, powders, or perfumes.   Do not shave 48 hours prior to surgery. Men may shave face and neck.  Do not bring valuables to the hospital.  [facility] is not responsible for any belongings or valuables.               Contacts, dentures or bridgework may not be worn into surgery.  Leave suitcase in the car. After surgery it may be brought to your room.  For patients admitted to the hospital, discharge time is determined by your treatment team.               Patients discharged the day of surgery will not be allowed to drive home.  Name and phone number of your driver: family  Special Instructions: Shower using CHG 2 nights before surgery and the night before surgery.  If you shower the day of surgery use CHG.  Use special wash - you have one bottle of CHG for all showers.  You should use approximately 1/3 of the bottle for each shower.   Please read over the following fact sheets that you were given: Pain Booklet, Coughing and Deep Breathing, Surgical Site Infection Prevention, Anesthesia Post-op Instructions and Care and Recovery After Surgery Laparoscopic Cholecystectomy Laparoscopic cholecystectomy is surgery to remove the gallbladder. The gallbladder is located in the upper right part of the abdomen, behind the liver. It is a storage sac for bile produced in the liver. Bile aids in the digestion and absorption of fats. Cholecystectomy is often done for inflammation of the gallbladder (cholecystitis). This condition is usually caused by a buildup of gallstones (cholelithiasis) in your  gallbladder. Gallstones can block the flow of bile, resulting in inflammation and pain. In severe cases, emergency surgery may be required. When emergency surgery is not required, you will have time to prepare for the procedure. Laparoscopic surgery is an alternative to open surgery. Laparoscopic surgery has a shorter recovery time. Your common bile duct may also need to be examined during the procedure. If stones are found in the common bile duct, they may be removed. LET [facility] CARE PROVIDER KNOW ABOUT:  Any allergies you have.  All medicines you are taking, including vitamins, herbs, eye drops, creams, and over-the-counter medicines.  Previous problems you or members of your family have had with the use of anesthetics.  Any blood disorders you have.  Previous surgeries you have had.  Medical conditions you have. RISKS AND COMPLICATIONS Generally, this is a safe procedure. However, as with any procedure, complications can occur. Possible complications include:  Infection.  Damage to the common bile duct, nerves, arteries, veins, or other internal organs such as the stomach, liver, or intestines.  Bleeding.  A stone may remain in the common bile duct.  A bile leak from the cyst duct that is clipped when your gallbladder is removed.  The need to convert to open surgery, which requires a larger incision in the abdomen. This may be necessary if your surgeon thinks it  is not safe to continue with a laparoscopic procedure. BEFORE THE PROCEDURE  Ask your health care provider about changing or stopping any regular medicines. You will need to stop taking aspirin or blood thinners at least 5 days prior to surgery.  Do not eat or drink anything after midnight the night before surgery.  Let your health care provider know if you develop a cold or other infectious problem before surgery. PROCEDURE   You will be given medicine to make you sleep through the procedure (general  anesthetic). A breathing tube will be placed in your mouth.  When you are asleep, your surgeon will make several small cuts (incisions) in your abdomen.  A thin, lighted tube with a tiny camera on the end (laparoscope) is inserted through one of the small incisions. The camera on the laparoscope sends a picture to a TV screen in the operating room. This gives the surgeon a good view inside your abdomen.  A gas will be pumped into your abdomen. This expands your abdomen so that the surgeon has more room to perform the surgery.  Other tools needed for the procedure are inserted through the other incisions. The gallbladder is removed through one of the incisions.  After the removal of your gallbladder, the incisions will be closed with stitches, staples, or skin glue. AFTER THE PROCEDURE  You will be taken to a recovery area where your progress will be checked often.  You may be allowed to go home the same day if your pain is controlled and you can tolerate liquids. Document Released: 12/25/2004 Document Revised: 10/15/2012 Document Reviewed: 08/06/2012 [facility] Patient Information 2015 [place], [place]. This information is not intended to replace advice given to you by your health care provider. Make sure you discuss any questions you have with your health care provider. PATIENT INSTRUCTIONS POST-ANESTHESIA  IMMEDIATELY FOLLOWING SURGERY:  Do not drive or operate machinery for the first twenty four hours after surgery.  Do not make any important decisions for twenty four hours after surgery or while taking narcotic pain medications or sedatives.  If you develop intractable nausea and vomiting or a severe headache please notify your doctor immediately.  FOLLOW-UP:  Please make an appointment with your surgeon as instructed. You do not need to follow up with anesthesia unless specifically instructed to do so.  WOUND CARE INSTRUCTIONS (if applicable):  Keep a dry clean dressing on the  anesthesia/puncture wound site if there is drainage.  Once the wound has quit draining you may leave it open to air.  Generally you should leave the bandage intact for twenty four hours unless there is drainage.  If the epidural site drains for more than 36-48 hours please call the anesthesia department.  QUESTIONS?:  Please feel free to call your physician or the hospital operator if you have any questions, and they will be happy to assist you.

## 2013-10-23 NOTE — H&P (Signed)
  NTS SOAP Note  Vital Signs:  Vitals as of: 78/24/2353: Systolic 614: Diastolic 88: Heart Rate 78: Temp 98.7F: Height 7ft 4in: Weight 140Lbs 0 Ounces: Pain Level 8: BMI 24.03  BMI : 24.03 kg/m2  Subjective: This 48 year old female presents for of biliary colic.  Has been having intermittent episodes of right upper quadrant abdominal pain with radiation to the right flank,  nausea,  and fatty food intolerance.  No fever,  chills,  jaundice.   Review of Symptoms:  Constitutional:unremarkable   Head:unremarkable Eyes:unremarkable   sinus Cardiovascular:  unremarkable Respiratory:unremarkable Gastrointestinabdominal pain, nausea, heartburn Genitourinary:unremarkable   Musculoskeletal:unremarkable Skin:unremarkable Hematolgic/Lymphatic:unremarkable   Allergic/Immunologic:unremarkable   Past Medical History:  Reviewed  Past Medical History  Surgical History: hysterectomy Allergies: nkda Medications: amitiza,  escitalopram   Social History:Reviewed  Social History  Preferred Language: English Race:  Black or African American Ethnicity: Not Hispanic / Latino Age: 48 year Marital Status:  S Alcohol: no   Smoking Status: Never smoker reviewed on 10/22/2013 Functional Status reviewed on 10/22/2013 ------------------------------------------------ Bathing: Normal Cooking: Normal Dressing: Normal Driving: Normal Eating: Normal Managing Meds: Normal Oral Care: Normal Shopping: Normal Toileting: Normal Transferring: Normal Walking: Normal Cognitive Status reviewed on 10/22/2013 ------------------------------------------------ Attention: Normal Decision Making: Normal Language: Normal Memory: Normal Motor: Normal Perception: Normal Problem Solving: Normal Visual and Spatial: Normal   Family History:Reviewed  Family Health History Family History is Unknown    Objective Information: General:Well appearing, well nourished in no  distress. no scleral icterus Heart:RRR, no murmur or gallop.  Normal S1, S2.  No S3, S4.  Lungs:  CTA bilaterally, no wheezes, rhonchi, rales.  Breathing unlabored. Abdomen:Soft, tender in right upper quadrant to palpation,  ND, normal bowel sounds, no HSM, no masses.  No peritoneal signs.  Assessment:cholecystitis,  cholelithiasis  Diagnoses: 574.00  K80.00 Calculus of gallbladder with acute cholecystitis (Calculus of gallbladder with acute cholecystitis without obstruction)  Procedures: 43154 - OFFICE OUTPATIENT NEW 30 MINUTES    Plan:  Scheduled for laparoscopic cholecystectomy on 10/26/13.   Patient Education:Alternative treatments to surgery were discussed with patient (and family).  Risks and benefits  of procedure including bleeding,  infection,  hepatobiliary injury,  and the possibility of an open procedure were fully explained to the patient (and family) who gave informed consent. Patient/family questions were addressed.  Follow-up:Pending Surgery

## 2013-10-26 ENCOUNTER — Ambulatory Visit (HOSPITAL_COMMUNITY)
Admission: RE | Admit: 2013-10-26 | Discharge: 2013-10-26 | Disposition: A | Discharge status: Home / Self Care | Payer: BC Managed Care – PPO | Source: Ambulatory Visit | Attending: General Surgery | Admitting: General Surgery

## 2013-10-26 ENCOUNTER — Encounter (HOSPITAL_COMMUNITY): Payer: BC Managed Care – PPO | Admitting: Anesthesiology

## 2013-10-26 ENCOUNTER — Encounter (HOSPITAL_COMMUNITY)
Admission: RE | Disposition: A | Discharge status: Home / Self Care | Payer: Self-pay | Source: Ambulatory Visit | Attending: General Surgery

## 2013-10-26 ENCOUNTER — Encounter (HOSPITAL_COMMUNITY): Payer: Self-pay | Admitting: *Deleted

## 2013-10-26 ENCOUNTER — Ambulatory Visit (HOSPITAL_COMMUNITY): Payer: BC Managed Care – PPO | Admitting: Anesthesiology

## 2013-10-26 DIAGNOSIS — F419 Anxiety disorder, unspecified: Secondary | ICD-10-CM | POA: Insufficient documentation

## 2013-10-26 DIAGNOSIS — Z79899 Other long term (current) drug therapy: Secondary | ICD-10-CM | POA: Diagnosis not present

## 2013-10-26 DIAGNOSIS — D649 Anemia, unspecified: Secondary | ICD-10-CM | POA: Diagnosis not present

## 2013-10-26 DIAGNOSIS — K8010 Calculus of gallbladder with chronic cholecystitis without obstruction: Secondary | ICD-10-CM | POA: Insufficient documentation

## 2013-10-26 HISTORY — PX: CHOLECYSTECTOMY: SHX55

## 2013-10-26 SURGERY — LAPAROSCOPIC CHOLECYSTECTOMY
Anesthesia: General

## 2013-10-26 MED ORDER — ROCURONIUM BROMIDE 50 MG/5ML IV SOLN
INTRAVENOUS | Status: AC
  Filled 2013-10-26: qty 1

## 2013-10-26 MED ORDER — FENTANYL CITRATE 0.05 MG/ML IJ SOLN: 25.0000 ug | INTRAMUSCULAR | Status: DC | PRN

## 2013-10-26 MED ORDER — ONDANSETRON HCL 4 MG/2ML IJ SOLN
4.0000 mg | Freq: Once | INTRAMUSCULAR | Status: AC
  Administered 2013-10-26: 4 mg via INTRAVENOUS

## 2013-10-26 MED ORDER — KETOROLAC TROMETHAMINE 30 MG/ML IJ SOLN
30.0000 mg | Freq: Once | INTRAMUSCULAR | Status: AC
  Administered 2013-10-26: 30 mg via INTRAVENOUS

## 2013-10-26 MED ORDER — BUPIVACAINE HCL (PF) 0.5 % IJ SOLN
INTRAMUSCULAR | Status: AC
  Filled 2013-10-26: qty 30

## 2013-10-26 MED ORDER — LIDOCAINE HCL (PF) 1 % IJ SOLN
INTRAMUSCULAR | Status: AC
Start: 2013-10-26 — End: 2013-10-26
  Filled 2013-10-26: qty 5

## 2013-10-26 MED ORDER — NEOSTIGMINE METHYLSULFATE 10 MG/10ML IV SOLN
INTRAVENOUS | Status: DC | PRN
  Administered 2013-10-26 (×2): 2 mg via INTRAVENOUS

## 2013-10-26 MED ORDER — CHLORHEXIDINE GLUCONATE 4 % EX LIQD: 1.0000 "application " | Freq: Once | CUTANEOUS | Status: DC

## 2013-10-26 MED ORDER — ROCURONIUM BROMIDE 100 MG/10ML IV SOLN
INTRAVENOUS | Status: DC | PRN
  Administered 2013-10-26: 30 mg via INTRAVENOUS

## 2013-10-26 MED ORDER — LIDOCAINE HCL 1 % IJ SOLN
INTRAMUSCULAR | Status: DC | PRN
  Administered 2013-10-26: 50 mg via INTRADERMAL

## 2013-10-26 MED ORDER — POVIDONE-IODINE 10 % EX OINT
TOPICAL_OINTMENT | CUTANEOUS | Status: AC
  Filled 2013-10-26: qty 1

## 2013-10-26 MED ORDER — GLYCOPYRROLATE 0.2 MG/ML IJ SOLN
INTRAMUSCULAR | Status: AC
  Filled 2013-10-26: qty 2

## 2013-10-26 MED ORDER — OXYCODONE-ACETAMINOPHEN 7.5-325 MG PO TABS
1.0000 | ORAL_TABLET | Freq: Four times a day (QID) | ORAL | Status: DC | PRN
Start: 2013-10-26 — End: 2014-03-15

## 2013-10-26 MED ORDER — HEMOSTATIC AGENTS (NO CHARGE) OPTIME
TOPICAL | Status: DC | PRN
  Administered 2013-10-26: 1 via TOPICAL

## 2013-10-26 MED ORDER — POVIDONE-IODINE 10 % OINT PACKET
TOPICAL_OINTMENT | CUTANEOUS | Status: DC | PRN
  Administered 2013-10-26: 1 via TOPICAL

## 2013-10-26 MED ORDER — MIDAZOLAM HCL 5 MG/5ML IJ SOLN
INTRAMUSCULAR | Status: DC | PRN
  Administered 2013-10-26: 2 mg via INTRAVENOUS

## 2013-10-26 MED ORDER — LACTATED RINGERS IV SOLN
INTRAVENOUS | Status: DC
  Administered 2013-10-26: 11:00:00 via INTRAVENOUS

## 2013-10-26 MED ORDER — CIPROFLOXACIN IN D5W 200 MG/100ML IV SOLN
INTRAVENOUS | Status: AC
  Filled 2013-10-26: qty 200

## 2013-10-26 MED ORDER — FENTANYL CITRATE 0.05 MG/ML IJ SOLN
INTRAMUSCULAR | Status: DC | PRN
  Administered 2013-10-26: 50 ug via INTRAVENOUS
  Administered 2013-10-26: 100 ug via INTRAVENOUS
  Administered 2013-10-26 (×2): 50 ug via INTRAVENOUS

## 2013-10-26 MED ORDER — CIPROFLOXACIN IN D5W 400 MG/200ML IV SOLN
400.0000 mg | INTRAVENOUS | Status: AC
  Administered 2013-10-26: 400 mg via INTRAVENOUS

## 2013-10-26 MED ORDER — MIDAZOLAM HCL 2 MG/2ML IJ SOLN
1.0000 mg | INTRAMUSCULAR | Status: DC | PRN
  Administered 2013-10-26: 2 mg via INTRAVENOUS

## 2013-10-26 MED ORDER — NEOSTIGMINE METHYLSULFATE 10 MG/10ML IV SOLN
INTRAVENOUS | Status: AC
  Filled 2013-10-26: qty 1

## 2013-10-26 MED ORDER — ONDANSETRON HCL 4 MG/2ML IJ SOLN: 4.0000 mg | Freq: Once | INTRAMUSCULAR | Status: DC | PRN

## 2013-10-26 MED ORDER — BUPIVACAINE HCL (PF) 0.5 % IJ SOLN
INTRAMUSCULAR | Status: DC | PRN
  Administered 2013-10-26: 9 mL

## 2013-10-26 MED ORDER — FENTANYL CITRATE 0.05 MG/ML IJ SOLN
INTRAMUSCULAR | Status: AC
  Filled 2013-10-26: qty 5

## 2013-10-26 MED ORDER — MIDAZOLAM HCL 2 MG/2ML IJ SOLN
INTRAMUSCULAR | Status: AC
  Filled 2013-10-26: qty 2

## 2013-10-26 MED ORDER — GLYCOPYRROLATE 0.2 MG/ML IJ SOLN
INTRAMUSCULAR | Status: DC | PRN
  Administered 2013-10-26: 0.4 mg via INTRAVENOUS

## 2013-10-26 MED ORDER — PROPOFOL 10 MG/ML IV EMUL
INTRAVENOUS | Status: AC
  Filled 2013-10-26: qty 20

## 2013-10-26 MED ORDER — SODIUM CHLORIDE 0.9 % IR SOLN
Status: DC | PRN
  Administered 2013-10-26: 1000 mL

## 2013-10-26 MED ORDER — PROPOFOL 10 MG/ML IV BOLUS
INTRAVENOUS | Status: DC | PRN
  Administered 2013-10-26: 150 mg via INTRAVENOUS

## 2013-10-26 MED ORDER — ONDANSETRON HCL 4 MG/2ML IJ SOLN
INTRAMUSCULAR | Status: AC
  Filled 2013-10-26: qty 2

## 2013-10-26 SURGICAL SUPPLY — 41 items
APPLIER CLIP LAPSCP 10X32 DD (CLIP) ×3 IMPLANT
BAG HAMPER (MISCELLANEOUS) ×3 IMPLANT
BLADE SURG SZ11 CARB STEEL (BLADE) ×3 IMPLANT
CHLORAPREP W/TINT 26ML (MISCELLANEOUS) ×3 IMPLANT
CLOTH BEACON ORANGE TIMEOUT ST (SAFETY) ×3 IMPLANT
COVER LIGHT HANDLE STERIS (MISCELLANEOUS) ×6 IMPLANT
DECANTER SPIKE VIAL GLASS SM (MISCELLANEOUS) ×3 IMPLANT
ELECT REM PT RETURN 9FT ADLT (ELECTROSURGICAL) ×3
ELECTRODE REM PT RTRN 9FT ADLT (ELECTROSURGICAL) ×1 IMPLANT
FILTER SMOKE EVAC LAPAROSHD (FILTER) ×3 IMPLANT
FORMALIN 10 PREFIL 120ML (MISCELLANEOUS) ×3 IMPLANT
GLOVE BIOGEL M 7.0 STRL (GLOVE) ×3 IMPLANT
GLOVE BIOGEL PI IND STRL 7.0 (GLOVE) ×3 IMPLANT
GLOVE BIOGEL PI INDICATOR 7.0 (GLOVE) ×6
GLOVE ECLIPSE 6.5 STRL STRAW (GLOVE) ×3 IMPLANT
GLOVE SURG SS PI 7.5 STRL IVOR (GLOVE) ×3 IMPLANT
GOWN STRL REUS W/ TWL XL LVL3 (GOWN DISPOSABLE) ×1 IMPLANT
GOWN STRL REUS W/TWL LRG LVL3 (GOWN DISPOSABLE) ×6 IMPLANT
GOWN STRL REUS W/TWL XL LVL3 (GOWN DISPOSABLE) ×2
HEMOSTAT SNOW SURGICEL 2X4 (HEMOSTASIS) ×3 IMPLANT
INST SET LAPROSCOPIC AP (KITS) ×3 IMPLANT
KIT ROOM TURNOVER APOR (KITS) ×3 IMPLANT
MANIFOLD NEPTUNE II (INSTRUMENTS) ×3 IMPLANT
NEEDLE INSUFFLATION 14GA 120MM (NEEDLE) ×3 IMPLANT
NS IRRIG 1000ML POUR BTL (IV SOLUTION) ×3 IMPLANT
PACK LAP CHOLE LZT030E (CUSTOM PROCEDURE TRAY) ×3 IMPLANT
PAD ARMBOARD 7.5X6 YLW CONV (MISCELLANEOUS) ×3 IMPLANT
POUCH SPECIMEN RETRIEVAL 10MM (ENDOMECHANICALS) ×3 IMPLANT
SET BASIN LINEN APH (SET/KITS/TRAYS/PACK) ×3 IMPLANT
SLEEVE ENDOPATH XCEL 5M (ENDOMECHANICALS) ×3 IMPLANT
SPONGE GAUZE 2X2 8PLY STER LF (GAUZE/BANDAGES/DRESSINGS) ×4
SPONGE GAUZE 2X2 8PLY STRL LF (GAUZE/BANDAGES/DRESSINGS) ×8 IMPLANT
STAPLER VISISTAT (STAPLE) ×3 IMPLANT
SUT VICRYL 0 UR6 27IN ABS (SUTURE) ×6 IMPLANT
TAPE CLOTH SOFT 2X10 (GAUZE/BANDAGES/DRESSINGS) ×3 IMPLANT
TROCAR ENDO BLADELESS 11MM (ENDOMECHANICALS) ×3 IMPLANT
TROCAR XCEL NON-BLD 5MMX100MML (ENDOMECHANICALS) ×3 IMPLANT
TROCAR XCEL UNIV SLVE 11M 100M (ENDOMECHANICALS) ×3 IMPLANT
TUBING INSUFFLATION (TUBING) ×3 IMPLANT
WARMER LAPAROSCOPE (MISCELLANEOUS) ×3 IMPLANT
YANKAUER SUCT 12FT TUBE ARGYLE (SUCTIONS) ×3 IMPLANT

## 2013-10-26 NOTE — Anesthesia Procedure Notes (Signed)
Procedure Name: Intubation Date/Time: 10/26/2013 11:44 AM Performed by: Charmaine Downs Pre-anesthesia Checklist: Patient being monitored, Suction available, Emergency Drugs available and Patient identified Patient Re-evaluated:Patient Re-evaluated prior to inductionOxygen Delivery Method: Circle system utilized Preoxygenation: Pre-oxygenation with 100% oxygen Intubation Type: IV induction Ventilation: Mask ventilation without difficulty and Oral airway inserted - appropriate to patient size Laryngoscope Size: 3 and Mac Grade View: Grade I Tube type: Oral Tube size: 7.0 mm Number of attempts: 1 Airway Equipment and Method: Stylet and Oral airway Placement Confirmation: ETT inserted through vocal cords under direct vision,  breath sounds checked- equal and bilateral and positive ETCO2 Secured at: 22 cm Tube secured with: Tape Dental Injury: Teeth and Oropharynx as per pre-operative assessment

## 2013-10-26 NOTE — Anesthesia Preprocedure Evaluation (Signed)
Anesthesia Evaluation  Patient identified by MRN, date of birth, ID band Patient awake    Reviewed: Allergy & Precautions, H&P , NPO status , Patient's Chart, lab work & pertinent test results  Airway Mallampati: I TM Distance: >3 FB Neck ROM: Full    Dental  (+) Poor Dentition, Chipped,    Pulmonary neg pulmonary ROS,  breath sounds clear to auscultation        Cardiovascular negative cardio ROS  Rhythm:Regular Rate:Normal     Neuro/Psych PSYCHIATRIC DISORDERS Anxiety    GI/Hepatic negative GI ROS,   Endo/Other    Renal/GU      Musculoskeletal   Abdominal   Peds  Hematology  (+) anemia ,   Anesthesia Other Findings   Reproductive/Obstetrics                           Anesthesia Physical Anesthesia Plan  ASA: II  Anesthesia Plan: General   Post-op Pain Management:    Induction: Intravenous  Airway Management Planned: Oral ETT  Additional Equipment:   Intra-op Plan:   Post-operative Plan: Extubation in OR  Informed Consent: I have reviewed the patients History and Physical, chart, labs and discussed the procedure including the risks, benefits and alternatives for the proposed anesthesia with the patient or authorized representative who has indicated his/her understanding and acceptance.     Plan Discussed with:   Anesthesia Plan Comments:         Anesthesia Quick Evaluation

## 2013-10-26 NOTE — Transfer of Care (Signed)
Immediate Anesthesia Transfer of Care Note  Patient: Breanna Campbell  Procedure(s) Performed: Procedure(s): LAPAROSCOPIC CHOLECYSTECTOMY (N/A)  Patient Location: PACU  Anesthesia Type:General  Level of Consciousness: awake and patient cooperative  Airway & Oxygen Therapy: Patient Spontanous Breathing and Patient connected to face mask oxygen  Post-op Assessment: Report given to PACU RN, Post -op Vital signs reviewed and stable and Patient moving all extremities  Post vital signs: Reviewed and stable  Complications: No apparent anesthesia complications

## 2013-10-26 NOTE — Discharge Instructions (Signed)

## 2013-10-26 NOTE — Op Note (Signed)
Patient:  Breanna Campbell  DOB:  1965-11-23  MRN:  [id number]   Preop Diagnosis:  Cholecystitis, cholelithiasis  Postop Diagnosis:  Same  Procedure:  Laparoscopic cholecystectomy  Surgeon:  Aviva Signs, M.D.  Anes:  General endotracheal  Indications:  Patient is a 48 year old white female who presents with cholecystitis secondary to cholelithiasis. The risks and benefits of the procedure including bleeding, infection, hepatobiliary injury, the possibility of an open procedure were fully explained to the patient, who gave informed consent.  Procedure note:  The patient is placed the supine position. After induction of general endotracheal anesthesia, the abdomen was prepped and draped using usual sterile technique with DuraPrep. Surgical site confirmation was performed.  An infraumbilical incision was made down to the fascia. A Veress needle was introduced into the abdominal cavity and confirmation of placement was done using the saline drop test. The abdomen was then insufflated to 16 mm mercury pressure. An 11 mm trocar was introduced into the abdominal cavity under direct visualization without difficulty. The patient was placed in reverse Trendelenburg position and additional 11 mm trocar was placed the epigastric region and 5 mm trochars were placed the right upper quadrant and right flank regions. Liver was inspected and noted within normal limits. The gallbladder was retracted in a dynamic fashion in order to expose the triangle of Calot. The cystic duct was first identified. Its juncture to the infundibulum was fully identified. Endoclips placed proximally and distally on the cystic duct, and the cystic duct was divided. The cystic artery was likewise ligated and divided. The gallbladder was freed away from the gallbladder fossa using Bovie electrocautery. The gallbladder was delivered through the epigastric trocar site using an Endo Catch bag. The gallbladder fossa was inspected and no  abnormal bleeding or bile leakage was noted. Surgicel is placed the gallbladder fossa. All fluid and air were then evacuated from the abdominal cavity prior to removal of the trochars.  All wounds were irrigated with normal saline. All wounds were injected with 0.5% Sensorcaine. The infraumbilical fashion as well as epigastric fascia reapproximated using 0 Vicryl interrupted sutures. All skin incisions were closed using staples. Betadine ointment and dry sterile dressings were applied.  All tape and needle counts were correct the end of the procedure. Patient was extubated in the operating room and transferred to PACU in stable condition.  Complications:  None  EBL:  Minimal  Specimen:  Gallbladder

## 2013-10-26 NOTE — Interval H&P Note (Signed)
History and Physical Interval Note:  10/26/2013 11:16 AM  Breanna Campbell  has presented today for surgery, with the diagnosis of acute cholecystitis; cholelithiasis  The various methods of treatment have been discussed with the patient and family. After consideration of risks, benefits and other options for treatment, the patient has consented to  Procedure(s): LAPAROSCOPIC CHOLECYSTECTOMY (N/A) as a surgical intervention .  The patient's history has been reviewed, patient examined, no change in status, stable for surgery.  I have reviewed the patient's chart and labs.  Questions were answered to the patient's satisfaction.     Aviva Signs A

## 2013-10-27 NOTE — Anesthesia Postprocedure Evaluation (Signed)
  Anesthesia Post-op Note  Patient: Breanna Campbell  Procedure(s) Performed: Procedure(s): LAPAROSCOPIC CHOLECYSTECTOMY (N/A)  Patient Location: PACU   Anesthesia Type:General    Level of Consciousness: awake and patient cooperative  Airway and Oxygen Therapy: Patient Spontanous Breathing and Patient connected to face mask oxygen  Post-op Pain: mild  Post-op Assessment: Patient's Cardiovascular Status Stable, Respiratory Function Stable, Patent Airway and No signs of Nausea or vomiting  Post-op Vital Signs: stable  Last Vitals:  Filed Vitals:   10/26/13 1400  BP: 136/81  Pulse: 59  Temp:   Resp: 17    Complications: No apparent anesthesia complications

## 2013-10-28 ENCOUNTER — Encounter (HOSPITAL_COMMUNITY): Payer: Self-pay | Admitting: General Surgery

## 2013-11-24 ENCOUNTER — Encounter: Payer: Self-pay | Admitting: Family Medicine

## 2013-11-24 ENCOUNTER — Ambulatory Visit (INDEPENDENT_AMBULATORY_CARE_PROVIDER_SITE_OTHER): Payer: BC Managed Care – PPO | Admitting: Family Medicine

## 2013-11-24 VITALS — BP 122/68 | HR 78 | Temp 98.9°F | Resp 14 | Ht 63.0 in | Wt 140.0 lb

## 2013-11-24 DIAGNOSIS — B001 Herpesviral vesicular dermatitis: Secondary | ICD-10-CM

## 2013-11-24 MED ORDER — ACYCLOVIR 5 % EX OINT: 1.0000 "application " | TOPICAL_OINTMENT | CUTANEOUS | Status: DC

## 2013-11-24 MED ORDER — VALACYCLOVIR HCL 1 G PO TABS: 2000.0000 mg | ORAL_TABLET | Freq: Two times a day (BID) | ORAL | Status: DC

## 2013-11-24 NOTE — Patient Instructions (Signed)
Take antibiotics as prescribed If this reoccurs then use ointment for 4 days  Herpes Labialis You have a fever blister or cold sore (herpes labialis). These painful, grouped sores are caused by one of the herpes viruses (HSV1 most commonly). They are usually found around the lips and mouth, but the same infection can also affect other areas on the face such as the nose and eyes. Herpes infections take about 10 days to heal. They often occur again and again in the same spot. Other symptoms may include numbness and tingling in the involved skin, achiness, fever, and swollen glands in the neck. Colds, emotional stress, injuries, or excess sunlight exposure all seem to make herpes reappear. Herpes lip infections are contagious. Direct contact with these sores can spread the infection. It can also be spread to other parts of your own body. TREATMENT  Herpes labialis is usually self-limited and resolves within 1 week. To reduce pain and swelling, apply ice packs frequently to the sores or suck on popsicles or frozen juice bars. Antiviral medicine may be used by mouth to shorten the duration of the breakout. Avoid spreading the infection by washing your hands often. Be careful not to touch your eyes or genital areas after handling the infected blisters. Do not kiss or have other intimate contact with others. After the blisters are completely healed you may resume contact. Use sunscreen to lessen recurrences.  If this is your first infection with herpes, or if you have a severe or repeated infections, your caregiver may prescribe one of the anti-viral drugs to speed up the healing. If you have sun-related flare-ups despite the use of sunscreen, starting oral anti-viral medicine before a prolonged exposure (going skiing or to the beach) can prevent most episodes.  SEEK IMMEDIATE MEDICAL CARE IF:  You develop a headache, sleepiness, high fever, vomiting, or severe weakness.  You have eye irritation, pain, blurred  vision or redness.  You develop a prolonged infection not getting better in 10 days. Document Released: 12/25/2004 Document Revised: 03/19/2011 Document Reviewed: 10/29/2008 [facility] Patient Information 2015 [place], [place]. This information is not intended to replace advice given to you by your health care provider. Make sure you discuss any questions you have with your health care provider.

## 2013-11-25 NOTE — Progress Notes (Signed)
Patient ID: Breanna Campbell, female   DOB: 1966-01-03, 48 y.o.   MRN: [id number]   Subjective:    Patient ID: Breanna Campbell, female    DOB: 1965/06/17, 47 y.o.   MRN: [id number]  Patient presents for Blisters  Patient is here with blisters on her bottom lower lip at the corner of her mouth. She has history of cold sores before with typically does not get morning 100 time. She is status post surgery for her gallbladder has been doing fairly well. She denies any lesions inside of her mouth denies any sore throat trouble breathing denies any swelling. She did try taking some Benadryl and using breeze   Review Of Systems:  GEN- denies fatigue, fever, weight loss,weakness, recent illness HEENT- denies eye drainage, change in vision, nasal discharge, CVS- denies chest pain, palpitations RESP- denies SOB, cough, wheeze ABD- denies N/V, change in stools, abd pain GU- denies dysuria, hematuria, dribbling, incontinence MSK- denies joint pain, muscle aches, injury Neuro- denies headache, dizziness, syncope, seizure activity       Objective:    BP 122/68 mmHg  Pulse 78  Temp(Src) 98.9 F (37.2 C) (Oral)  Resp 14  Ht 5\' 3"  (1.6 m)  Wt 140 lb (63.504 kg)  BMI 24.81 kg/m2  LMP 04/10/2011 GEN- NAD, alert and oriented x3 HEENT- PERRL, EOMI, non injected sclera, pink conjunctiva, MMM, oropharynx clear, lower lip 3 cold sores noted, with mild swelling of right half of lip, no pus or discharge expressed Neck- Supple, CVS- RRR, no murmur RESP-CTAB        Assessment & Plan:      Problem List Items Addressed This Visit    None    Visit Diagnoses    Herpes labialis    -  Primary    Treat with valtrex 2g X 2 doses, she does get this every few months, will give zovirax as well    Relevant Medications       valACYclovir (VALTREX) tablet       ZOVIRAX 5 % EX OINT       Note: This dictation was prepared with Dragon dictation along with smaller phrase technology. Any transcriptional  errors that result from this process are unintentional.

## 2013-12-02 ENCOUNTER — Encounter: Payer: Self-pay | Admitting: Family Medicine

## 2013-12-02 ENCOUNTER — Ambulatory Visit (INDEPENDENT_AMBULATORY_CARE_PROVIDER_SITE_OTHER): Payer: BC Managed Care – PPO | Admitting: Family Medicine

## 2013-12-02 VITALS — BP 120/62 | HR 62 | Temp 98.2°F | Resp 16 | Ht 63.0 in | Wt 139.0 lb

## 2013-12-02 DIAGNOSIS — F411 Generalized anxiety disorder: Secondary | ICD-10-CM

## 2013-12-02 DIAGNOSIS — E785 Hyperlipidemia, unspecified: Secondary | ICD-10-CM

## 2013-12-02 DIAGNOSIS — E559 Vitamin D deficiency, unspecified: Secondary | ICD-10-CM

## 2013-12-02 DIAGNOSIS — K5909 Other constipation: Secondary | ICD-10-CM

## 2013-12-02 DIAGNOSIS — E039 Hypothyroidism, unspecified: Secondary | ICD-10-CM

## 2013-12-02 DIAGNOSIS — Z23 Encounter for immunization: Secondary | ICD-10-CM

## 2013-12-02 LAB — CBC W/MCH & 3 PART DIFF
HCT: 36.5 % (ref 36.0–46.0)
HEMOGLOBIN: 12.4 g/dL (ref 12.0–15.0)
LYMPHS ABS: 2 10*3/uL (ref 0.7–4.0)
LYMPHS PCT: 38 % (ref 12–46)
MCH: 30 pg (ref 26.0–34.0)
MCHC: 34 g/dL (ref 30.0–36.0)
MCV: 88.4 fL (ref 78.0–100.0)
NEUTROS PCT: 54 % (ref 43–77)
Neutro Abs: 2.9 10*3/uL (ref 1.7–7.7)
Platelets: 217 10*3/uL (ref 150–400)
RBC: 4.13 MIL/uL (ref 3.87–5.11)
RDW: 12.9 % (ref 11.5–15.5)
WBC mixed population: 0.4 10*3/uL (ref 0.1–1.8)
WBC: 5.3 10*3/uL (ref 4.0–10.5)
WBCMIXPER: 8 % (ref 3–18)

## 2013-12-02 LAB — LIPID PANEL
CHOL/HDL RATIO: 3.1 ratio
Cholesterol: 147 mg/dL (ref 0–200)
HDL: 48 mg/dL (ref >39)
LDL Cholesterol: 80 mg/dL (ref 0–99)
Triglycerides: 94 mg/dL (ref <150)
VLDL: 19 mg/dL (ref 0–40)

## 2013-12-02 LAB — TSH: TSH: 2.538 u[IU]/mL (ref 0.350–4.500)

## 2013-12-02 NOTE — Progress Notes (Signed)
Patient ID: Breanna Campbell, female   DOB: [birth year], 48 y.o.   MRN: [id number]   Subjective:    Patient ID: Breanna Campbell, female    DOB: 1965/11/11, 48 y.o.   MRN: [id number]  Patient presents for 6 month F/U  patient here to follow-up chronic medical problems. She is no specific concerns today. She is due for repeat vitamin D level as well as lipid panel. She is status post surgery for her gallbladder and her appetite has improved her bowel movements are also good right now. Regarding her mood she's taken the Lexapro as prescribed states that it does help with her stress levels. She does not think the medication needs to be increased.  Flu shot given today    Review Of Systems:  GEN- denies fatigue, fever, weight loss,weakness, recent illness HEENT- denies eye drainage, change in vision, nasal discharge, CVS- denies chest pain, palpitations RESP- denies SOB, cough, wheeze ABD- denies N/V, change in stools, abd pain GU- denies dysuria, hematuria, dribbling, incontinence MSK- denies joint pain, muscle aches, injury Neuro- denies headache, dizziness, syncope, seizure activity       Objective:    BP 120/62 mmHg  Pulse 62  Temp(Src) 98.2 F (36.8 C) (Oral)  Resp 16  Ht 5\' 3"  (1.6 m)  Wt 139 lb (63.05 kg)  BMI 24.63 kg/m2  LMP 04/10/2011 GEN- NAD, alert and oriented x3 HEENT- PERRL, EOMI, non injected sclera, pink conjunctiva, MMM, oropharynx clear CVS- RRR, no murmur RESP-CTAB ABD-NABS,soft,NT,ND EXT- No edema Pulses- Radial 2+        Assessment & Plan:      Problem List Items Addressed This Visit    Vitamin D deficiency   Relevant Orders      Lipid Panel      Vitamin D, 25-hydroxy      CBC w/MCH & 3 Part Diff      TSH   Anxiety state   Relevant Orders      Lipid Panel      Vitamin D, 25-hydroxy      CBC w/MCH & 3 Part Diff      TSH    Other Visit Diagnoses    Need for prophylactic vaccination and inoculation against influenza    -  Primary    Relevant Orders       Flu Vaccine QUAD 36+ mos IM (Completed)       Lipid Panel       Vitamin D, 25-hydroxy       CBC w/MCH & 3 Part Diff       TSH    Hypothyroidism, unspecified hypothyroidism type        Relevant Orders       Lipid Panel       Vitamin D, 25-hydroxy       CBC w/MCH & 3 Part Diff       TSH    Hyperlipidemia        Relevant Orders       Lipid Panel       Vitamin D, 25-hydroxy       CBC w/MCH & 3 Part Diff       TSH       Note: This dictation was prepared with Dragon dictation along with smaller phrase technology. Any transcriptional errors that result from this process are unintentional.

## 2013-12-02 NOTE — Patient Instructions (Signed)
Continue current medications We will call with labs Flu shot given F/U 4 months 

## 2013-12-02 NOTE — Assessment & Plan Note (Signed)
Much improved, continue current meds

## 2013-12-02 NOTE — Assessment & Plan Note (Signed)
Continue current dose of lexapro, titrate as needed

## 2013-12-03 LAB — VITAMIN D 25 HYDROXY (VIT D DEFICIENCY, FRACTURES): Vit D, 25-Hydroxy: 20 ng/mL — ABNORMAL LOW (ref 30–100)

## 2013-12-04 ENCOUNTER — Ambulatory Visit: Payer: BC Managed Care – PPO | Admitting: Family Medicine

## 2013-12-08 ENCOUNTER — Other Ambulatory Visit: Payer: Self-pay | Admitting: *Deleted

## 2013-12-08 MED ORDER — VITAMIN D (ERGOCALCIFEROL) 1.25 MG (50000 UNIT) PO CAPS: 50000.0000 [IU] | ORAL_CAPSULE | ORAL | Status: DC

## 2014-01-27 ENCOUNTER — Telehealth: Payer: Self-pay | Admitting: Family Medicine

## 2014-01-27 NOTE — Telephone Encounter (Signed)
Patient says she fell out last night and got diarrhea afterwards, advised her to make appointment but wanted to speak with you first   [phone]

## 2014-01-27 NOTE — Telephone Encounter (Signed)
Call placed to patient.   Reports that she has had diarrhea x1 night. Reports that she has had loose brown watery stools x1 BM last night.   States that she broke out in a sweat while having BM. Denies abdominal discomfort, N/V.   States that she thought she was finished, but when she stood up, she became dizzy and faint. Reports that she lost consciousness for a few moments, and when she woke up, she was lying on the floor. Denies straining to pass stool.   Requested to have provider advise.

## 2014-01-27 NOTE — Telephone Encounter (Signed)
C/W  VasoVagal Syncope.  Tell her to f/u if has any other episode of presyncope/syncope.  O/W reassure her regarding Vasovagal syncope.

## 2014-01-28 NOTE — Telephone Encounter (Signed)
Patient returned call and made aware.

## 2014-03-15 ENCOUNTER — Encounter: Payer: Self-pay | Admitting: Physician Assistant

## 2014-03-15 ENCOUNTER — Ambulatory Visit (INDEPENDENT_AMBULATORY_CARE_PROVIDER_SITE_OTHER): Payer: BLUE CROSS/BLUE SHIELD | Admitting: Physician Assistant

## 2014-03-15 VITALS — BP 132/80 | HR 80 | Temp 98.4°F | Resp 18 | Wt 142.0 lb

## 2014-03-15 DIAGNOSIS — T7840XA Allergy, unspecified, initial encounter: Secondary | ICD-10-CM | POA: Insufficient documentation

## 2014-03-15 MED ORDER — DIPHENHYDRAMINE HCL 50 MG PO TABS: 50.0000 mg | ORAL_TABLET | Freq: Four times a day (QID) | ORAL | Status: DC | PRN

## 2014-03-15 MED ORDER — EPINEPHRINE 0.3 MG/0.3ML IJ SOAJ: 0.3000 mg | Freq: Once | INTRAMUSCULAR | Status: DC

## 2014-03-15 NOTE — Progress Notes (Signed)
Patient ID: DONYELL CARRELL MRN: [id number], DOB: 1965/11/12, 49 y.o. Date of Encounter: 03/15/2014, 10:07 AM    Chief Complaint:  Chief Complaint  Patient presents with  . poss allergic reaction    at work, eyes got puffy and had hives, happened last Wednesday     HPI: 49 y.o. 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 Says that she has been working there right at 1 year. Has never had any type of allergic reaction until last week. Says that she was pulling the yarn from a machine and her eyes started feeling itchy and then started feeling swelling around her eyelids. She then developed hives on her neck, her legs, and entire body. Says that her eyes got really puffy around her eyelids. She took Benadryl and it gradually resolved. 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  Has never had any type of allergic reaction to anything in the past. Says that the above reaction occurred on Wednesday 03/10/14.     Home Meds:   Outpatient Prescriptions Prior to Visit  Medication Sig Dispense Refill  . acyclovir ointment (ZOVIRAX) 5 % Apply 1 application topically every 3 (three) hours. For 4 days 30 g 0  . escitalopram (LEXAPRO) 5 MG tablet Take 1 tablet (5 mg total) by mouth at bedtime. 30 tablet 3  . lubiprostone (AMITIZA) 24 MCG capsule Take 1 capsule (24 mcg total) by mouth 2 (two) times daily with a meal. 60 capsule 6  . Vitamin D, Ergocalciferol, (DRISDOL) 50000 UNITS CAPS capsule Take 1 capsule (50,000 Units total) by mouth every 7 (seven) days. 12 capsule 0  . oxyCODONE-acetaminophen (PERCOCET) 7.5-325 MG per tablet Take 1-2 tablets by mouth every 6 (six) hours as needed. 50 tablet 0   No facility-administered medications prior to visit.    Allergies: No Known Allergies    Review of Systems: See HPI for pertinent ROS. All other ROS negative.    Physical Exam: Blood pressure 132/80, pulse 80, temperature 98.4 F  (36.9 C), temperature source Oral, resp. rate 18, weight 142 lb (64.411 kg), last menstrual period 04/10/2011., Body mass index is 25.16 kg/(m^2). General:  WNWD AAF. Appears in no acute distress. Lungs: Clear bilaterally to auscultation without wheezes, rales, or rhonchi. Breathing is unlabored. Heart: Regular rhythm. No murmurs, rubs, or gallops. Msk:  Strength and tone normal for age. Extremities/Skin: Warm and dry.Swelling around eyes has resolved. Urticaria has resolved.  Neuro: Alert and oriented X 3. Moves all extremities spontaneously. Gait is normal. CNII-XII grossly in tact. Psych:  Responds to questions appropriately with a normal affect.     ASSESSMENT AND PLAN:  49 y.o. year old female with  1. Allergic reaction, initial encounter Discussed that if she has recurrence of symptoms like she had last Wednesday, to take the Benadryl. Also call our office and schedule office visit.(We can prescribe prednisone if needed.) Also discussed indications for EpiPen. Discussed if she starts feeling itching and tightness and closing of her throat or any difficulty breathing to use the EpiPen immediately. Discussed  keeping an EpiPen with her at all times. She is to keep Benadryl and EpiPen with her at all times. Is to have them at work and available to use if needed at work. Today I discussed referral to allergist for allergy testing. She defers this at this point. I told her to call us if she changes her mind and wants  referral. - diphenhydrAMINE (BENADRYL) 50 MG tablet; Take 1 tablet (50 mg total) by mouth every 6 (six) hours as needed for itching or allergies.  Dispense: 30 tablet; Refill: 2 - EPINEPHrine 0.3 mg/0.3 mL IJ SOAJ injection; Inject 0.3 mLs (0.3 mg total) into the muscle once.  Dispense: 2 Device; Refill: 1   Signed, [address] [place], [place], [facility] 03/15/2014 10:07 AM

## 2014-04-02 ENCOUNTER — Encounter: Payer: Self-pay | Admitting: Family Medicine

## 2014-04-02 ENCOUNTER — Ambulatory Visit (INDEPENDENT_AMBULATORY_CARE_PROVIDER_SITE_OTHER): Payer: BLUE CROSS/BLUE SHIELD | Admitting: Family Medicine

## 2014-04-02 VITALS — BP 122/64 | HR 72 | Temp 98.2°F | Resp 14 | Ht 63.0 in | Wt 143.0 lb

## 2014-04-02 DIAGNOSIS — E559 Vitamin D deficiency, unspecified: Secondary | ICD-10-CM

## 2014-04-02 DIAGNOSIS — F411 Generalized anxiety disorder: Secondary | ICD-10-CM | POA: Diagnosis not present

## 2014-04-02 DIAGNOSIS — K5909 Other constipation: Secondary | ICD-10-CM | POA: Diagnosis not present

## 2014-04-02 MED ORDER — ESCITALOPRAM OXALATE 10 MG PO TABS: 10.0000 mg | ORAL_TABLET | Freq: Every day | ORAL | Status: DC

## 2014-04-02 NOTE — Progress Notes (Signed)
Patient ID: Breanna Campbell, female   DOB: 1965-12-19, 49 y.o.   MRN: [id number]   Subjective:    Patient ID: Breanna Campbell, female    DOB: 1965-03-23, 49 y.o.   MRN: [id number]  Patient presents for 4 moth F/U  Patient here to follow-up medications. She's been under a lot of stress as her daughter is currently pregnant and they try to work out finances so that everything can be ready for the baby. She finds her social stress and a lot of getting anxious and she would like to increase her Lexapro medication. In general things are doing fairly well on the job. She's also E on a regular basis and has not lost any weight. She does continue to have difficulty with her bowels however she has not been taking her constipation medicine on a regular basis. She had a fairly large bowel movement yesterday that ended with morbid soft stool after she had not been in a couple of days.   Review Of Systems:  GEN- denies fatigue, fever, weight loss,weakness, recent illness HEENT- denies eye drainage, change in vision, nasal discharge, CVS- denies chest pain, palpitations RESP- denies SOB, cough, wheeze ABD- denies N/V, + constipation, change in stools, abd pain GU- denies dysuria, hematuria, dribbling, incontinence MSK- denies joint pain, muscle aches, injury Neuro- denies headache, dizziness, syncope, seizure activity       Objective:    BP 122/64 mmHg  Pulse 72  Temp(Src) 98.2 F (36.8 C) (Oral)  Resp 14  Ht 5\' 3"  (1.6 m)  Wt 143 lb (64.864 kg)  BMI 25.34 kg/m2  LMP 04/10/2011 GEN- NAD, alert and oriented x3 HEENT- PERRL, EOMI, non injected sclera, pink conjunctiva, MMM, oropharynx clear CVS- RRR, no murmur RESP-CTAB ABD-NABS,soft,NT,ND Psych- normal affect and mood, no SI, well groomed, smiling Pulses- Radial - 2+        Assessment & Plan:      Problem List Items Addressed This Visit      Unprioritized   Vitamin D deficiency   Constipation - Primary   Anxiety state   Relevant Medications   escitalopram (LEXAPRO) tablet      Note: This dictation was prepared with Dragon dictation along with smaller phrase technology. Any transcriptional errors that result from this process are unintentional.

## 2014-04-02 NOTE — Patient Instructions (Signed)
Lexapro increased to 10mg , take 2 tablets of the 5mg  tablets Take constipation medication Increase water  F/U 4 months for PHYSICAL

## 2014-04-02 NOTE — Assessment & Plan Note (Signed)
Increase Lexapro to 10 mg once a day

## 2014-04-02 NOTE — Assessment & Plan Note (Signed)
This does the importance of taking her medication a regular basis and to prevent her constipation also increasing her water and fiber intake No change to the Amitiza

## 2014-07-29 ENCOUNTER — Other Ambulatory Visit: Payer: BLUE CROSS/BLUE SHIELD

## 2014-07-29 DIAGNOSIS — D649 Anemia, unspecified: Secondary | ICD-10-CM

## 2014-07-29 DIAGNOSIS — F419 Anxiety disorder, unspecified: Secondary | ICD-10-CM

## 2014-07-29 DIAGNOSIS — Z0000 Encounter for general adult medical examination without abnormal findings: Secondary | ICD-10-CM

## 2014-07-29 DIAGNOSIS — Z79899 Other long term (current) drug therapy: Secondary | ICD-10-CM

## 2014-07-29 DIAGNOSIS — E559 Vitamin D deficiency, unspecified: Secondary | ICD-10-CM

## 2014-07-29 LAB — CBC WITH DIFFERENTIAL/PLATELET
Basophils Absolute: 0.1 10*3/uL (ref 0.0–0.1)
Basophils Relative: 1 % (ref 0–1)
Eosinophils Absolute: 0.2 10*3/uL (ref 0.0–0.7)
Eosinophils Relative: 3 % (ref 0–5)
HCT: 37.6 % (ref 36.0–46.0)
HEMOGLOBIN: 12.1 g/dL (ref 12.0–15.0)
LYMPHS ABS: 2.3 10*3/uL (ref 0.7–4.0)
LYMPHS PCT: 37 % (ref 12–46)
MCH: 28.8 pg (ref 26.0–34.0)
MCHC: 32.2 g/dL (ref 30.0–36.0)
MCV: 89.5 fL (ref 78.0–100.0)
MONOS PCT: 8 % (ref 3–12)
MPV: 9.8 fL (ref 8.6–12.4)
Monocytes Absolute: 0.5 10*3/uL (ref 0.1–1.0)
Neutro Abs: 3.1 10*3/uL (ref 1.7–7.7)
Neutrophils Relative %: 51 % (ref 43–77)
Platelets: 310 10*3/uL (ref 150–400)
RBC: 4.2 MIL/uL (ref 3.87–5.11)
RDW: 12.7 % (ref 11.5–15.5)
WBC: 6.1 10*3/uL (ref 4.0–10.5)

## 2014-07-29 LAB — LIPID PANEL
CHOLESTEROL: 157 mg/dL (ref 0–200)
HDL: 55 mg/dL (ref >=46)
LDL Cholesterol: 87 mg/dL (ref 0–99)
TRIGLYCERIDES: 73 mg/dL (ref <150)
Total CHOL/HDL Ratio: 2.9 Ratio
VLDL: 15 mg/dL (ref 0–40)

## 2014-07-29 LAB — COMPLETE METABOLIC PANEL WITH GFR
ALT: 13 U/L (ref 0–35)
AST: 18 U/L (ref 0–37)
Albumin: 3.7 g/dL (ref 3.5–5.2)
Alkaline Phosphatase: 62 U/L (ref 39–117)
BUN: 12 mg/dL (ref 6–23)
CO2: 20 mEq/L (ref 19–32)
CREATININE: 0.67 mg/dL (ref 0.50–1.10)
Calcium: 9.5 mg/dL (ref 8.4–10.5)
Chloride: 98 mEq/L (ref 96–112)
GFR, Est African American: >89 mL/min
GFR, Est Non African American: >89 mL/min
GLUCOSE: 86 mg/dL (ref 70–99)
Potassium: 4 mEq/L (ref 3.5–5.3)
SODIUM: 133 meq/L — AB (ref 135–145)
Total Bilirubin: 0.7 mg/dL (ref 0.2–1.2)
Total Protein: 8 g/dL (ref 6.0–8.3)

## 2014-07-30 LAB — VITAMIN D 25 HYDROXY (VIT D DEFICIENCY, FRACTURES): Vit D, 25-Hydroxy: 24 ng/mL — ABNORMAL LOW (ref 30–100)

## 2014-08-02 ENCOUNTER — Encounter: Payer: BLUE CROSS/BLUE SHIELD | Admitting: Family Medicine

## 2015-12-12 ENCOUNTER — Ambulatory Visit (INDEPENDENT_AMBULATORY_CARE_PROVIDER_SITE_OTHER): Payer: BLUE CROSS/BLUE SHIELD | Admitting: Physician Assistant

## 2015-12-12 ENCOUNTER — Encounter: Payer: Self-pay | Admitting: Physician Assistant

## 2015-12-12 VITALS — BP 124/82 | HR 86 | Temp 98.0°F | Resp 16 | Wt 163.0 lb

## 2015-12-12 DIAGNOSIS — L239 Allergic contact dermatitis, unspecified cause: Secondary | ICD-10-CM | POA: Diagnosis not present

## 2015-12-12 DIAGNOSIS — T7840XA Allergy, unspecified, initial encounter: Secondary | ICD-10-CM | POA: Diagnosis not present

## 2015-12-12 MED ORDER — DIPHENHYDRAMINE HCL 50 MG PO TABS: 50.0000 mg | ORAL_TABLET | Freq: Four times a day (QID) | ORAL | 2 refills | Status: DC | PRN

## 2015-12-12 MED ORDER — PREDNISONE 20 MG PO TABS: ORAL_TABLET | ORAL | 0 refills | Status: DC

## 2015-12-12 NOTE — Progress Notes (Signed)
Patient ID: Breanna Campbell MRN: [id number], DOB: [birth year], 50 y.o. Date of Encounter: 12/12/2015, 4:52 PM    Chief Complaint:  Chief Complaint  Patient presents with  . Rash     HPI: 50 y.o. year old female presents with above.  Says that something has been biting her on the skin. Says that she is quite certain that it is happening at work. 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 She has multiple whelped, itchy areas on her skin. No other complaints or concerns.     Home Meds:   Outpatient Medications Prior to Visit  Medication Sig Dispense Refill  . acyclovir ointment (ZOVIRAX) 5 % Apply 1 application topically every 3 (three) hours. For 4 days 30 g 0  . cholecalciferol (VITAMIN D) 1000 UNITS tablet Take 1,000 Units by mouth daily.    . cyanocobalamin 100 MCG tablet Take 100 mcg by mouth daily.    Marland Kitchen EPINEPHrine 0.3 mg/0.3 mL IJ SOAJ injection Inject 0.3 mLs (0.3 mg total) into the muscle once. 2 Device 1  . escitalopram (LEXAPRO) 10 MG tablet Take 1 tablet (10 mg total) by mouth at bedtime. 30 tablet 6  . lubiprostone (AMITIZA) 24 MCG capsule Take 1 capsule (24 mcg total) by mouth 2 (two) times daily with a meal. 60 capsule 6  . diphenhydrAMINE (BENADRYL) 50 MG tablet Take 1 tablet (50 mg total) by mouth every 6 (six) hours as needed for itching or allergies. 30 tablet 2   No facility-administered medications prior to visit.     Allergies: No Known Allergies    Review of Systems: See HPI for pertinent ROS. All other ROS negative.    Physical Exam: Blood pressure 124/82, pulse 86, temperature 98 F (36.7 C), temperature source Oral, resp. rate 16, weight 163 lb (73.9 kg), last menstrual period 04/10/2011, SpO2 99 %., Body mass index is 28.87 kg/m. General:  WNWD AAF. Appears in no acute distress. Neck: Supple. No thyromegaly. No lymphadenopathy. Lungs: Clear  bilaterally to auscultation without wheezes, rales, or rhonchi. Breathing is unlabored. Heart: Regular rhythm. No murmurs, rubs, or gallops. Msk:  Strength and tone normal for age. Skin: Whelped slightly erythematous papules/urticaria areas--- Left hand has slightly erythematous papule. Right forearm has ~ 0.5inch diameter area of urticaria. Forehead has 3 areas of papules/urticaria Neuro: Alert and oriented X 3. Moves all extremities spontaneously. Gait is normal. CNII-XII grossly in tact. Psych:  Responds to questions appropriately with a normal affect.     ASSESSMENT AND PLAN:  50 y.o. year old female with  1. Allergic dermatitis Discussed that these could be flea bites bedbugs scabies. Or could be some other type of insect bite coming from her work. She states that no one else lives in her home has any itching or bites. She is convinced that they are not coming from her house but is convinced that there happening at work. Therefore discussed that I can give her medicine to help clear up current sites but she will have to figure out how to avoid future bites to avoid reoccurrence. Recommend she take Benadryl on a regular basis for several days and to take the prednisone taper as directed. Follow-up if sites do not resolve. - predniSONE (DELTASONE) 20 MG tablet; Take 3 daily for 2 days, then 2 daily for 2 days, then 1 daily for 2 days.  Dispense: 12 tablet;  Refill: 0 - diphenhydrAMINE (BENADRYL) 50 MG tablet; Take 1 tablet (50 mg total) by mouth every 6 (six) hours as needed for itching or allergies.    [address] [place], [place], [facility] 12/12/2015 4:52 PM

## 2015-12-20 ENCOUNTER — Ambulatory Visit (INDEPENDENT_AMBULATORY_CARE_PROVIDER_SITE_OTHER): Payer: BLUE CROSS/BLUE SHIELD | Admitting: Family Medicine

## 2015-12-20 ENCOUNTER — Encounter: Payer: Self-pay | Admitting: Family Medicine

## 2015-12-20 VITALS — BP 122/68 | HR 90 | Temp 98.5°F | Resp 14 | Ht 63.0 in | Wt 166.0 lb

## 2015-12-20 DIAGNOSIS — B86 Scabies: Secondary | ICD-10-CM

## 2015-12-20 DIAGNOSIS — R7301 Impaired fasting glucose: Secondary | ICD-10-CM | POA: Diagnosis not present

## 2015-12-20 DIAGNOSIS — Z23 Encounter for immunization: Secondary | ICD-10-CM

## 2015-12-20 MED ORDER — HYDROXYZINE HCL 25 MG PO TABS: 25.0000 mg | ORAL_TABLET | Freq: Three times a day (TID) | ORAL | 0 refills | Status: DC | PRN

## 2015-12-20 MED ORDER — METHYLPREDNISOLONE ACETATE 40 MG/ML IJ SUSP
40.0000 mg | Freq: Once | INTRAMUSCULAR | Status: AC
  Administered 2015-12-20: 40 mg via INTRAMUSCULAR

## 2015-12-20 MED ORDER — PERMETHRIN 5 % EX CREA: 1.0000 "application " | TOPICAL_CREAM | Freq: Once | CUTANEOUS | 0 refills | Status: AC

## 2015-12-20 NOTE — Progress Notes (Signed)
Subjective:    Patient ID: Breanna Campbell, female    DOB: Apr 24, 1965, 50 y.o.   MRN: [id number]  Patient presents for Rash (x2 weeks- red bumps all over arms, back and face- reports increaed itching in areas) Patient here with itchy rash on bilateral arms extending up to the shoulders. She was seen on October 4 at that time diagnosis was allergic dermatitis versus a possible mite infection. She was given prednisone taper and told to take Benadryl. The day after she was seen she did have swelling of her left upper eye and she had 2 lesions on her face this did go down the prednisone. She is still breaking out lesions on her hands and upper part of her shoulders. 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 No one in her home has any rash including the children. She has not felt ill she has not had any fever no new medications. I also reviewed her wellness labs from work. Her fasting blood glucose was elevated at 105 she has gained 20 pounds since last year eating a lot of carbs including bread   Review Of Systems:  GEN- denies fatigue, fever, weight loss,weakness, recent illness HEENT- denies eye drainage, change in vision, nasal discharge, CVS- denies chest pain, palpitations RESP- denies SOB, cough, wheeze ABD- denies N/V, change in stools, abd pain GU- denies dysuria, hematuria, dribbling, incontinence MSK- denies joint pain, muscle aches, injury Neuro- denies headache, dizziness, syncope, seizure activity       Objective:    BP 122/68 (BP Location: Right Arm, Patient Position: Sitting, Cuff Size: Normal)   Pulse 90   Temp 98.5 F (36.9 C) (Oral)   Resp 14   Ht 5\' 3"  (1.6 m)   Wt 166 lb (75.3 kg)   LMP 04/10/2011   SpO2 99%   BMI 29.41 kg/m  GEN- NAD, alert and oriented x3 HEENT- PERRL, EOMI, non injected sclera, pink conjunctiva, MMM, oropharynx clear Neck- Supple, no  LAD  CVS- RRR, no murmur RESP-CTAB Skin- bilat bites  arms with erythema, scabs and excoriations, extend up to shoulder, 2 lesions left upper back, few lesions in web spaces on hands         Assessment & Plan:      Problem List Items Addressed This Visit    None    Visit Diagnoses    Scabies    -  Primary   Based on appearance this looks like a mite infection we'll treat with permethrin ,give her hydroxyzine  for itching andif this still does not clear with biopsy.  I did give Depo Medrol due to intense itching today    Relevant Medications   permethrin (ACTICIN) 5 % cream   hydrOXYzine (ATARAX/VISTARIL) 25 MG tablet   methylPREDNISolone acetate (DEPO-MEDROL) injection 40 mg (Completed)   Other Relevant Orders   Basic metabolic panel   Hemoglobin A1c   Flu Vaccine QUAD 36+ mos IM (Completed)   Elevated fasting glucose       check A1C discussed dietary changes   Relevant Medications   permethrin (ACTICIN) 5 % cream   hydrOXYzine (ATARAX/VISTARIL) 25 MG tablet   methylPREDNISolone acetate (DEPO-MEDROL) injection 40 mg (Completed)   Other Relevant Orders   Basic metabolic panel   Hemoglobin A1c   Flu Vaccine QUAD 36+ mos IM (Completed)   Need for prophylactic vaccination and inoculation against influenza       Relevant Medications   permethrin (ACTICIN)  5 % cream   hydrOXYzine (ATARAX/VISTARIL) 25 MG tablet   methylPREDNISolone acetate (DEPO-MEDROL) injection 40 mg (Completed)   Other Relevant Orders   Basic metabolic panel   Hemoglobin A1c   Flu Vaccine QUAD 36+ mos IM (Completed)      Note: This dictation was prepared with Dragon dictation along with smaller phrase technology. Any transcriptional errors that result from this process are unintentional.

## 2015-12-20 NOTE — Patient Instructions (Signed)
Apply cream from neck down, sleep in it, rinse in morning Take hydroxyzine for itching  If not improved, biopsy to be done We will call with lab results Flu shot given F/U pending results

## 2015-12-21 LAB — HEMOGLOBIN A1C
HEMOGLOBIN A1C: 4.9 % (ref <5.7)
MEAN PLASMA GLUCOSE: 94 mg/dL

## 2015-12-21 LAB — BASIC METABOLIC PANEL
BUN: 12 mg/dL (ref 7–25)
CHLORIDE: 101 mmol/L (ref 98–110)
CO2: 27 mmol/L (ref 20–31)
Calcium: 10.1 mg/dL (ref 8.6–10.2)
Creat: 0.75 mg/dL (ref 0.50–1.10)
Glucose, Bld: 80 mg/dL (ref 70–99)
POTASSIUM: 4.7 mmol/L (ref 3.5–5.3)
Sodium: 137 mmol/L (ref 135–146)

## 2015-12-22 ENCOUNTER — Encounter: Payer: Self-pay | Admitting: *Deleted

## 2016-04-12 ENCOUNTER — Encounter: Payer: Self-pay | Admitting: Family Medicine

## 2016-05-09 ENCOUNTER — Encounter: Payer: Self-pay | Admitting: Family Medicine

## 2017-05-31 ENCOUNTER — Ambulatory Visit: Payer: BLUE CROSS/BLUE SHIELD | Admitting: Family Medicine

## 2017-05-31 ENCOUNTER — Other Ambulatory Visit: Payer: Self-pay

## 2017-05-31 ENCOUNTER — Encounter: Payer: Self-pay | Admitting: Family Medicine

## 2017-05-31 VITALS — BP 132/70 | HR 82 | Temp 98.7°F | Resp 14 | Ht 63.0 in | Wt 163.0 lb

## 2017-05-31 DIAGNOSIS — M25562 Pain in left knee: Secondary | ICD-10-CM

## 2017-05-31 DIAGNOSIS — M65349 Trigger finger, unspecified ring finger: Secondary | ICD-10-CM | POA: Diagnosis not present

## 2017-05-31 DIAGNOSIS — M25462 Effusion, left knee: Secondary | ICD-10-CM | POA: Diagnosis not present

## 2017-05-31 DIAGNOSIS — M79641 Pain in right hand: Secondary | ICD-10-CM

## 2017-05-31 MED ORDER — MELOXICAM 15 MG PO TABS: 15.0000 mg | ORAL_TABLET | Freq: Every day | ORAL | 1 refills | Status: DC

## 2017-05-31 NOTE — Patient Instructions (Signed)
F/U schedule a physical

## 2017-05-31 NOTE — Progress Notes (Signed)
   Subjective:    Patient ID: Breanna Campbell, female    DOB: [birth year], 52 y.o.   MRN: [id number]  Patient presents for Knee Pain (x1 month- L knee edema and pain) Has new job 6 months , does cleaning off the machines , 8 hour shifts, no heavy lifting,but having bilat hand and left knee pain  Recently brought knee sleeves to help with support  Has noticed leg knee swelling and popping for past month or so, does not give out  Hand pain, bilaterally, has to hold water hose, to clean, tries to swithc hands,both ring fingers are stiff, and wont bend the right way   Taking tylenol advil      Review Of Systems:  GEN- denies fatigue, fever, weight loss,weakness, recent illness HEENT- denies eye drainage, change in vision, nasal discharge, CVS- denies chest pain, palpitations RESP- denies SOB, cough, wheeze ABD- denies N/V, change in stools, abd pain GU- denies dysuria, hematuria, dribbling, incontinence MSK- + joint pain, muscle aches, injury Neuro- denies headache, dizziness, syncope, seizure activity       Objective:    BP 132/70   Pulse 82   Temp 98.7 F (37.1 C) (Oral)   Resp 14   Ht 5\' 3"  (1.6 m)   Wt 163 lb (73.9 kg)   LMP 04/10/2011   SpO2 98%   BMI 28.87 kg/m  GEN- NAD, alert and oriented x3 CVS- RRR, no murmur RESP-CTAB MSK- bilat hands- mild swelling decreased flexion of bilat ring fingers R > L, unable to make full fist. FROM wrist  Right knee- normal inspection, FROM, left knee- swelling laterally, decreased ROM, +crepitus, ligaments in tact EXT- No edema Pulses- Radial, DP- 2+        Assessment & Plan:      Problem List Items Addressed This Visit    None      Note: This dictation was prepared with Dragon dictation along with smaller phrase technology. Any transcriptional errors that result from this process are unintentional.

## 2017-06-03 ENCOUNTER — Encounter: Payer: Self-pay | Admitting: Family Medicine

## 2017-06-04 ENCOUNTER — Ambulatory Visit (HOSPITAL_COMMUNITY)
Admission: RE | Admit: 2017-06-04 | Discharge: 2017-06-04 | Disposition: A | Discharge status: Home / Self Care | Payer: BLUE CROSS/BLUE SHIELD | Source: Ambulatory Visit | Attending: Family Medicine | Admitting: Family Medicine

## 2017-06-04 DIAGNOSIS — M25562 Pain in left knee: Secondary | ICD-10-CM | POA: Diagnosis not present

## 2017-06-04 DIAGNOSIS — M25462 Effusion, left knee: Secondary | ICD-10-CM

## 2017-06-04 DIAGNOSIS — M79641 Pain in right hand: Secondary | ICD-10-CM | POA: Diagnosis not present

## 2017-06-17 ENCOUNTER — Other Ambulatory Visit: Payer: Self-pay | Admitting: *Deleted

## 2017-06-17 MED ORDER — ACYCLOVIR 5 % EX OINT: 1.0000 "application " | TOPICAL_OINTMENT | CUTANEOUS | 0 refills | Status: DC

## 2017-12-17 ENCOUNTER — Other Ambulatory Visit: Payer: Self-pay

## 2017-12-17 ENCOUNTER — Ambulatory Visit (INDEPENDENT_AMBULATORY_CARE_PROVIDER_SITE_OTHER): Payer: BLUE CROSS/BLUE SHIELD | Admitting: Family Medicine

## 2017-12-17 ENCOUNTER — Encounter: Payer: Self-pay | Admitting: Family Medicine

## 2017-12-17 VITALS — BP 128/72 | HR 82 | Temp 98.2°F | Resp 14 | Ht 63.0 in | Wt 168.0 lb

## 2017-12-17 DIAGNOSIS — N939 Abnormal uterine and vaginal bleeding, unspecified: Secondary | ICD-10-CM

## 2017-12-17 DIAGNOSIS — Z23 Encounter for immunization: Secondary | ICD-10-CM

## 2017-12-17 DIAGNOSIS — Z1211 Encounter for screening for malignant neoplasm of colon: Secondary | ICD-10-CM | POA: Diagnosis not present

## 2017-12-17 DIAGNOSIS — Z114 Encounter for screening for human immunodeficiency virus [HIV]: Secondary | ICD-10-CM

## 2017-12-17 DIAGNOSIS — Z13220 Encounter for screening for lipoid disorders: Secondary | ICD-10-CM

## 2017-12-17 DIAGNOSIS — Z124 Encounter for screening for malignant neoplasm of cervix: Secondary | ICD-10-CM

## 2017-12-17 DIAGNOSIS — Z1239 Encounter for other screening for malignant neoplasm of breast: Secondary | ICD-10-CM

## 2017-12-17 DIAGNOSIS — Z0000 Encounter for general adult medical examination without abnormal findings: Secondary | ICD-10-CM

## 2017-12-17 LAB — WET PREP FOR TRICH, YEAST, CLUE

## 2017-12-17 NOTE — Progress Notes (Signed)
   Subjective:    Patient ID: Breanna Campbell, female    DOB: [birth year], 52 y.o.   MRN: [id number]  Patient presents for Gynecologic Exam (is not fasting)  Pt here for CPE  Due for Colon cancer screening, now age 52  Due for Mammogram Due for PAP Smear, last in 2014 DIscussed HIV screening   No menses in > 1 year, has had occ spotting , she had had a supracervical hysterectomy , no vaginal discharge  Due for flu shot   She did have some pig feet this morning  Family history updated   Review Of Systems:  GEN- denies fatigue, fever, weight loss,weakness, recent illness HEENT- denies eye drainage, change in vision, nasal discharge, CVS- denies chest pain, palpitations RESP- denies SOB, cough, wheeze ABD- denies N/V, change in stools, abd pain GU- denies dysuria, hematuria, dribbling, incontinence MSK- denies joint pain, muscle aches, injury Neuro- denies headache, dizziness, syncope, seizure activity       Objective:    BP 128/72   Pulse 82   Temp 98.2 F (36.8 C) (Oral)   Resp 14   Ht 5\' 3"  (1.6 m)   Wt 168 lb (76.2 kg)   LMP 04/10/2011   SpO2 97%   BMI 29.76 kg/m  GEN- NAD, alert and oriented x3 HEENT- PERRL, EOMI, non injected sclera, pink conjunctiva, MMM, oropharynx clear Neck- Supple, no thyromegaly Breast- normal symmetry, no nipple inversion,no nipple drainage, no nodules or lumps felt Nodes- no axillary nodes CVS- RRR, no murmur RESP-CTAB ABD-NABS,soft,NT,ND GU- normal external genitalia, vaginal mucosa pink and moist, cervix visualized no growth, + blood form os, no clear discharge, no CMT, no ovarian masses,  EXT- No edema Pulses- Radial, DP- 2+        Assessment & Plan:      Problem List Items Addressed This Visit    None    Visit Diagnoses    Routine general medical examination at a health care facility    -  Primary   CPE done, cologuard for colon cancer screen, pt to schedule mammogram, labs done, HIV screen,PAP Smear though concern she  has had some spotting will see results of PAP and r/o infection on wet prep Pending results, will set up with GYN AUDIT C/FALL/Depression screening NEGATIVE   Relevant Orders   CBC with Differential/Platelet   Comprehensive metabolic panel   Lipid panel   Screening cholesterol level       Relevant Orders   Lipid panel   Cervical cancer screening       Relevant Orders   Pap IG w/ reflex to HPV when ASC-U   Colon cancer screening       Encounter for screening for HIV       Relevant Orders   HIV Antibody (routine testing w rflx)   Breast cancer screening       Relevant Orders   MM 3D SCREEN BREAST BILATERAL   Vaginal spotting       Relevant Orders   WET PREP FOR [place], YEAST, CLUE      Note: This dictation was prepared with Dragon dictation along with smaller phrase technology. Any transcriptional errors that result from this process are unintentional.

## 2017-12-17 NOTE — Patient Instructions (Signed)
I recommend eye visit once a year I recommend dental visit every 6 months Goal is to  Exercise 30 minutes 5 days a week We will send a letter with lab results  Schedule your mammogram Call and set up Cologuard F/U 1 year for Physical

## 2017-12-18 LAB — COMPREHENSIVE METABOLIC PANEL
AG Ratio: 1.2 (calc) (ref 1.0–2.5)
ALKALINE PHOSPHATASE (APISO): 68 U/L (ref 33–130)
ALT: 10 U/L (ref 6–29)
AST: 13 U/L (ref 10–35)
Albumin: 4 g/dL (ref 3.6–5.1)
BILIRUBIN TOTAL: 0.2 mg/dL (ref 0.2–1.2)
BUN: 16 mg/dL (ref 7–25)
CHLORIDE: 107 mmol/L (ref 98–110)
CO2: 23 mmol/L (ref 20–32)
Calcium: 8.8 mg/dL (ref 8.6–10.4)
Creat: 0.79 mg/dL (ref 0.50–1.05)
GLOBULIN: 3.3 g/dL (ref 1.9–3.7)
GLUCOSE: 112 mg/dL — AB (ref 65–99)
POTASSIUM: 3.7 mmol/L (ref 3.5–5.3)
Sodium: 139 mmol/L (ref 135–146)
Total Protein: 7.3 g/dL (ref 6.1–8.1)

## 2017-12-18 LAB — LIPID PANEL
CHOL/HDL RATIO: 4.5 (calc) (ref <5.0)
CHOLESTEROL: 168 mg/dL (ref <200)
HDL: 37 mg/dL — ABNORMAL LOW (ref >50)
LDL Cholesterol (Calc): 99 mg/dL (calc)
Non-HDL Cholesterol (Calc): 131 mg/dL (calc) — ABNORMAL HIGH (ref <130)
Triglycerides: 199 mg/dL — ABNORMAL HIGH (ref <150)

## 2017-12-18 LAB — CBC WITH DIFFERENTIAL/PLATELET
BASOS PCT: 1 %
Basophils Absolute: 71 cells/uL (ref 0–200)
EOS PCT: 4.6 %
Eosinophils Absolute: 327 cells/uL (ref 15–500)
HCT: 33.1 % — ABNORMAL LOW (ref 35.0–45.0)
HEMOGLOBIN: 10.7 g/dL — AB (ref 11.7–15.5)
Lymphs Abs: 3302 cells/uL (ref 850–3900)
MCH: 29 pg (ref 27.0–33.0)
MCHC: 32.3 g/dL (ref 32.0–36.0)
MCV: 89.7 fL (ref 80.0–100.0)
MPV: 10.8 fL (ref 7.5–12.5)
Monocytes Relative: 7.8 %
NEUTROS ABS: 2847 {cells}/uL (ref 1500–7800)
Neutrophils Relative %: 40.1 %
PLATELETS: 307 10*3/uL (ref 140–400)
RBC: 3.69 10*6/uL — ABNORMAL LOW (ref 3.80–5.10)
RDW: 11.5 % (ref 11.0–15.0)
TOTAL LYMPHOCYTE: 46.5 %
WBC mixed population: 554 cells/uL (ref 200–950)
WBC: 7.1 10*3/uL (ref 3.8–10.8)

## 2017-12-18 LAB — HIV ANTIBODY (ROUTINE TESTING W REFLEX): HIV: NONREACTIVE

## 2017-12-18 LAB — PAP IG W/ RFLX HPV ASCU

## 2017-12-19 ENCOUNTER — Other Ambulatory Visit: Payer: Self-pay | Admitting: Family Medicine

## 2017-12-19 ENCOUNTER — Other Ambulatory Visit: Payer: Self-pay | Admitting: *Deleted

## 2017-12-19 DIAGNOSIS — Z1239 Encounter for other screening for malignant neoplasm of breast: Secondary | ICD-10-CM

## 2017-12-19 MED ORDER — METRONIDAZOLE 500 MG PO TABS: 500.0000 mg | ORAL_TABLET | Freq: Two times a day (BID) | ORAL | 0 refills | Status: DC

## 2018-01-21 ENCOUNTER — Ambulatory Visit (HOSPITAL_COMMUNITY): Payer: BLUE CROSS/BLUE SHIELD

## 2018-01-21 ENCOUNTER — Encounter (HOSPITAL_COMMUNITY): Payer: BLUE CROSS/BLUE SHIELD

## 2018-02-20 ENCOUNTER — Ambulatory Visit (INDEPENDENT_AMBULATORY_CARE_PROVIDER_SITE_OTHER): Payer: BLUE CROSS/BLUE SHIELD | Admitting: Family Medicine

## 2018-02-20 ENCOUNTER — Emergency Department (HOSPITAL_COMMUNITY)
Admission: EM | Admit: 2018-02-20 | Discharge: 2018-02-20 | Disposition: A | Discharge status: Home / Self Care | Payer: BLUE CROSS/BLUE SHIELD | Attending: Emergency Medicine | Admitting: Emergency Medicine

## 2018-02-20 ENCOUNTER — Encounter (HOSPITAL_COMMUNITY): Payer: Self-pay | Admitting: Emergency Medicine

## 2018-02-20 ENCOUNTER — Encounter: Payer: Self-pay | Admitting: Family Medicine

## 2018-02-20 VITALS — BP 122/84 | HR 79 | Temp 98.2°F | Resp 15 | Ht 63.0 in | Wt 169.2 lb

## 2018-02-20 DIAGNOSIS — T5391XA Toxic effect of unspecified halogen derivatives of aliphatic and aromatic hydrocarbons, accidental (unintentional), initial encounter: Secondary | ICD-10-CM | POA: Diagnosis not present

## 2018-02-20 DIAGNOSIS — Y990 Civilian activity done for income or pay: Secondary | ICD-10-CM | POA: Diagnosis not present

## 2018-02-20 DIAGNOSIS — R51 Headache: Secondary | ICD-10-CM | POA: Diagnosis not present

## 2018-02-20 DIAGNOSIS — Z9049 Acquired absence of other specified parts of digestive tract: Secondary | ICD-10-CM | POA: Insufficient documentation

## 2018-02-20 DIAGNOSIS — Z79899 Other long term (current) drug therapy: Secondary | ICD-10-CM | POA: Diagnosis not present

## 2018-02-20 DIAGNOSIS — R519 Headache, unspecified: Secondary | ICD-10-CM

## 2018-02-20 DIAGNOSIS — Z7729 Contact with and (suspected ) exposure to other hazardous substances: Secondary | ICD-10-CM | POA: Diagnosis not present

## 2018-02-20 DIAGNOSIS — F419 Anxiety disorder, unspecified: Secondary | ICD-10-CM | POA: Diagnosis not present

## 2018-02-20 DIAGNOSIS — T1490XA Injury, unspecified, initial encounter: Secondary | ICD-10-CM

## 2018-02-20 DIAGNOSIS — J689 Unspecified respiratory condition due to chemicals, gases, fumes and vapors: Secondary | ICD-10-CM | POA: Diagnosis not present

## 2018-02-20 DIAGNOSIS — T5991XA Toxic effect of unspecified gases, fumes and vapors, accidental (unintentional), initial encounter: Secondary | ICD-10-CM | POA: Diagnosis not present

## 2018-02-20 LAB — COOXEMETRY PANEL
Carboxyhemoglobin: 1.1 % (ref 0.5–1.5)
Methemoglobin: 1.5 % (ref 0.0–1.5)
O2 Saturation: 85.5 %
Total hemoglobin: 12.7 g/dL (ref 12.0–16.0)

## 2018-02-20 MED ORDER — ACETAMINOPHEN 500 MG PO TABS
500.0000 mg | ORAL_TABLET | Freq: Once | ORAL | Status: AC
  Administered 2018-02-20: 500 mg via ORAL
  Filled 2018-02-20: qty 1

## 2018-02-20 NOTE — Progress Notes (Signed)
Patient ID: Breanna Campbell, female    DOB: 1965/10/29, 53 y.o.   MRN: [id number]  PCP: Alycia Rossetti, MD  Chief Complaint  Patient presents with  . Headache    Patient in today with c/o headache,nausea, and dry mouth. Patient states that she was at work and could smell a gas like substance last night when symptom  started  . Nausea    Subjective:   Breanna Campbell is a 53 y.o. female, presents to clinic with CC of headache, nausea and near syncope that began last night while at work.  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  It became fairly strong about an hour or 2 later she began having some nausea and near syncope.  Her another coworker went to a cooler and better ventilated area to take a break and take some deep breaths and slightly after this their supervisor was notified and it was found that there was a truck apparently running backed up to the building when which they work but the patient does not know how long the truck was there or how long it was running for.  She states that she was smelling the fumes possibly exposed from about 12 or 1 AM until 5 or 6 AM.   She got off work and went home between 7 and 8 AM her headache developed she states it is a pressure and throbbing across her forehead constant severity 6 out of 10.  No alleviating or aggravating factors, she denies associated photophobia, dizziness, numbness, tingling, neck pain.  She has not taken any medications for it. She continues to feel very queasy and nauseous but she has not had any vomiting she was able to keep down some crackers and soda. She denies abdominal pain, shortness of breath, she did cough up a little phlegm but she is not continuing to cough she denies any sore throat or sensation of swelling.  Denies dysphasia, difficulty speaking swallowing or talking, no  chest or back pain.  She denies palpitations, racing heart, orthopnea, PND, lower extremity edema, exertional dyspnea or exertional chest pain.  Denies any history of cardiovascular disease.  She knows that the supervisor was notified around 5 AM and a coworker was symptomatic with her around 3 or 4 AM but she does not believe anything else was initiated such as a Sport and exercise psychologist. claim.  She just came here this morning because she felt ill.  She denies any history of lung disease, asthma, no smoking history.     Patient Active Problem List   Diagnosis Date Noted  . Allergic reaction 03/15/2014  . Anxiety state 10/10/2013  . Abdominal pain 03/06/2013  . Gallstones 01/25/2013  . Vitamin D deficiency 01/25/2013  . Weight loss, unintentional 12/23/2012  . Decreased appetite 12/23/2012  . Encounter for routine gynecological examination 03/25/2012  . Constipation 10/24/2011  . Anemia 10/24/2011  . Fibroid uterus 05/08/2011    Class: Diagnosis of     Prior to Admission medications   Medication Sig Start Date End Date Taking? Authorizing Provider  cholecalciferol (VITAMIN D) 1000 UNITS tablet Take 1,000 Units by mouth daily.   Yes [provider]  acyclovir ointment (ZOVIRAX) 5 % Apply 1 application topically every 3 (three) hours. For 4 days Patient not taking: Reported on 02/20/2018 06/17/17   [facility],  Modena Nunnery, MD  meloxicam (MOBIC) 15 MG tablet Take 1 tablet (15 mg total) by mouth daily. Patient not taking: Reported on 02/20/2018 05/31/17   Alycia Rossetti, MD  metroNIDAZOLE (FLAGYL) 500 MG tablet Take 1 tablet (500 mg total) by mouth 2 (two) times daily. Patient not taking: Reported on 02/20/2018 12/19/17   Alycia Rossetti, MD     No Known Allergies   Family History  Problem Relation Age of Onset  . Diverticulitis Mother   . Hypertension Mother   . Hypothyroidism Mother   . Hypertension Father   . Hyperlipidemia Father   . Anesthesia problems Neg Hx   . Hypotension  Neg Hx   . Malignant hyperthermia Neg Hx   . Pseudochol deficiency Neg Hx      Social History   Socioeconomic History  . Marital status: Married    Spouse name: Not on file  . Number of children: Not on file  . Years of education: Not on file  . Highest education level: Not on file  Occupational History  . Not on file  Social Needs  . Financial resource strain: Not on file  . Food insecurity:    Worry: Not on file    Inability: Not on file  . Transportation needs:    Medical: Not on file    Non-medical: Not on file  Tobacco Use  . Smoking status: Never Smoker  . Smokeless tobacco: Never Used  Substance and Sexual Activity  . Alcohol use: No  . Drug use: No  . Sexual activity: Yes    Birth control/protection: Surgical  Lifestyle  . Physical activity:    Days per week: Not on file    Minutes per session: Not on file  . Stress: Not on file  Relationships  . Social connections:    Talks on phone: Not on file    Gets together: Not on file    Attends religious service: Not on file    Active member of club or organization: Not on file    Attends meetings of clubs or organizations: Not on file    Relationship status: Not on file  . Intimate partner violence:    Fear of current or ex partner: Not on file    Emotionally abused: Not on file    Physically abused: Not on file    Forced sexual activity: Not on file  Other Topics Concern  . Not on file  Social History Narrative  . Not on file     Review of Systems  Constitutional: Negative.  Negative for activity change, appetite change, chills, diaphoresis, fatigue, fever and unexpected weight change.  HENT: Negative.  Negative for congestion, ear discharge, ear pain, postnasal drip, rhinorrhea, sinus pressure, sinus pain, sneezing, sore throat, trouble swallowing and voice change.   Eyes: Negative.   Respiratory: Negative.  Negative for choking, chest tightness, shortness of breath, wheezing and stridor.     Cardiovascular: Negative.  Negative for chest pain, palpitations and leg swelling.  Gastrointestinal: Negative.  Negative for abdominal distention, abdominal pain, anal bleeding, blood in stool, constipation, diarrhea, rectal pain and vomiting.  Endocrine: Negative.   Genitourinary: Negative.   Musculoskeletal: Negative.  Negative for arthralgias, gait problem, neck pain and neck stiffness.  Skin: Negative.  Negative for color change, pallor and rash.  Allergic/Immunologic: Negative.  Negative for immunocompromised state.  Neurological: Negative.  Negative for dizziness, tremors, seizures, syncope, facial asymmetry, speech difficulty, weakness and numbness.  Hematological: Negative.   Psychiatric/Behavioral:  Negative.  Negative for confusion.  All other systems reviewed and are negative.      Objective:    Vitals:   02/20/18 0950  BP: 122/84  Pulse: 79  Resp: 15  Temp: 98.2 F (36.8 C)  TempSrc: Oral  SpO2: 100%  Weight: 169 lb 4 oz (76.8 kg)  Height: 5\' 3"  (1.6 m)      Physical Exam Vitals signs and nursing note reviewed.  Constitutional:      General: She is not in acute distress.    Appearance: Normal appearance. She is well-developed. She is not ill-appearing, toxic-appearing or diaphoretic.  HENT:     Head: Normocephalic and atraumatic.     Jaw: There is normal jaw occlusion.     Right Ear: External ear normal.     Left Ear: External ear normal.     Nose: Rhinorrhea present. No congestion. Rhinorrhea is clear.     Right Nostril: No occlusion.     Left Nostril: No occlusion.     Right Turbinates: Enlarged.     Left Turbinates: Enlarged.     Mouth/Throat:     Lips: Pink.     Mouth: Mucous membranes are moist.     Palate: Palate does not elevate in midline.     Pharynx: Uvula midline. No uvula swelling.     Tonsils: No tonsillar exudate. Swelling: 3+ on the right. 3+ on the left.  Eyes:     General: Lids are normal. No scleral icterus.    Extraocular  Movements: Extraocular movements intact.     Right eye: Normal extraocular motion and no nystagmus.     Left eye: Normal extraocular motion and no nystagmus.     Conjunctiva/sclera: Conjunctivae normal.     Pupils: Pupils are equal, round, and reactive to light.  Neck:     Musculoskeletal: Normal range of motion and neck supple. No neck rigidity.     Trachea: Phonation normal. No tracheal deviation.  Cardiovascular:     Rate and Rhythm: Normal rate and regular rhythm.     Pulses: Normal pulses.          Radial pulses are 2+ on the right side and 2+ on the left side.       Posterior tibial pulses are 2+ on the right side and 2+ on the left side.     Heart sounds: Normal heart sounds. No murmur. No friction rub. No gallop.   Pulmonary:     Effort: Pulmonary effort is normal. No tachypnea, accessory muscle usage or respiratory distress.     Breath sounds: Normal breath sounds. No stridor, decreased air movement or transmitted upper airway sounds. No decreased breath sounds, wheezing, rhonchi or rales.  Chest:     Chest wall: No tenderness.  Abdominal:     General: Bowel sounds are normal. There is no distension.     Palpations: Abdomen is soft. There is no mass.     Tenderness: There is no abdominal tenderness. There is no right CVA tenderness, left CVA tenderness, guarding or rebound. Negative signs include Murphy's sign, Rovsing's sign and McBurney's sign.  Musculoskeletal: Normal range of motion.        General: No deformity.  Lymphadenopathy:     Cervical: No cervical adenopathy.  Skin:    General: Skin is warm and dry.     Capillary Refill: Capillary refill takes less than 2 seconds.     Coloration: Skin is not cyanotic or pale.     Findings: No  erythema or rash.  Neurological:     Mental Status: She is alert and oriented to person, place, and time.     Cranial Nerves: Cranial nerves are intact. No facial asymmetry.     Sensory: Sensation is intact. No sensory deficit.      Motor: Motor function is intact. No weakness or abnormal muscle tone.     Coordination: Coordination is intact. Coordination normal.     Gait: Gait normal.  Psychiatric:        Speech: Speech normal.        Behavior: Behavior normal.           Assessment & Plan:      ICD-10-CM   1. Exposure to smoke from industrial source Z77.29   2. Acute nonintractable headache, unspecified headache type R51     Pt with HA, N, near syncope, onset last night while at work, between 3 am and 8 am, with possible exposure to gaseous fumes or exhaust from truck.  We called poison control, I checked our internal lab here to see if we could do carboxyhemoglobin testing but it is unable to be done stat, additionally poison control advised that patient should go to [facility], ER for evaluation and they specified including EKG and blood tests for carbon monoxide. Patient is well-appearing here, she slightly nervous, vital signs stable, lungs clear to auscultation, airway intact she has large tonsils bilaterally, uvula midline no airway edema or erythema, she denies any sensation of throat swelling or closure -she continues to have headache and nausea is grossly normal neurological evaluation.  Reviewed all the information that we got from poison control with her and the basic assessment of what may be done in the ER and she understands she is good to go directly there she is going to contact her employer to initiate Worker's Comp. claim or some kind of report regarding this, her friend is driving, they were instructed to pull over call 911 if for any reason she has any chest pain, difficulty breathing or syncopal episode.  Report called to the ER by ourselves and also poison control is contacting them regarding the case they opened up for this patient.   Delsa Grana, PA-C 02/20/18 10:22 AM

## 2018-02-20 NOTE — Patient Instructions (Signed)
Go to the ER for evaluation for possible exhaust exposure, carbonmonoxide testing - per poison control. Call HR and have them initiate a claim since this occurred last night at work, and the smells and fumes were reported and you began to have symptoms while at work.

## 2018-02-20 NOTE — ED Provider Notes (Signed)
[facility] EMERGENCY DEPARTMENT Provider Note   CSN: [id number] Arrival date & time: 02/20/18  1105     History   Chief Complaint Chief Complaint  Patient presents with  . Toxic Inhalation    HPI Breanna Campbell is a 53 y.o. female.  HPI Patient came in after smelling something at work.  States it smells like kerosene.  Patient states she was told it was from a truck that was idling nearby.  Has a dull headache.  No shortness of breath.  States someone by her also smelled the same thing.  Patient has some mild nausea.  Worried for carbon monoxide poisoning.  Patient does not smoke.  No confusion. Past Medical History:  Diagnosis Date  . Anemia     Patient Active Problem List   Diagnosis Date Noted  . Allergic reaction 03/15/2014  . Anxiety state 10/10/2013  . Abdominal pain 03/06/2013  . Gallstones 01/25/2013  . Vitamin D deficiency 01/25/2013  . Weight loss, unintentional 12/23/2012  . Decreased appetite 12/23/2012  . Encounter for routine gynecological examination 03/25/2012  . Constipation 10/24/2011  . Anemia 10/24/2011  . Fibroid uterus 05/08/2011    Class: Diagnosis of    Past Surgical History:  Procedure Laterality Date  . CHOLECYSTECTOMY N/A 10/26/2013   Procedure: LAPAROSCOPIC CHOLECYSTECTOMY;  Surgeon: Jamesetta So, MD;  Location: [facility];  Service: General;  Laterality: N/A;  . LAPAROSCOPIC SUPRACERVICAL HYSTERECTOMY  2013  . TUBAL LIGATION       OB History   No obstetric history on file.      Home Medications    Prior to Admission medications   Medication Sig Start Date End Date Taking? Authorizing Provider  acyclovir ointment (ZOVIRAX) 5 % Apply 1 application topically every 3 (three) hours. For 4 days Patient not taking: Reported on 02/20/2018 06/17/17   Alycia Rossetti, MD  cholecalciferol (VITAMIN D) 1000 UNITS tablet Take 1,000 Units by mouth daily.    [provider]  meloxicam (MOBIC) 15 MG tablet Take 1  tablet (15 mg total) by mouth daily. Patient not taking: Reported on 02/20/2018 05/31/17   Alycia Rossetti, MD  metroNIDAZOLE (FLAGYL) 500 MG tablet Take 1 tablet (500 mg total) by mouth 2 (two) times daily. Patient not taking: Reported on 02/20/2018 12/19/17   Alycia Rossetti, MD    Family History Family History  Problem Relation Age of Onset  . Diverticulitis Mother   . Hypertension Mother   . Hypothyroidism Mother   . Hypertension Father   . Hyperlipidemia Father   . Anesthesia problems Neg Hx   . Hypotension Neg Hx   . Malignant hyperthermia Neg Hx   . Pseudochol deficiency Neg Hx     Social History Social History   Tobacco Use  . Smoking status: Never Smoker  . Smokeless tobacco: Never Used  Substance Use Topics  . Alcohol use: No  . Drug use: No     Allergies   Patient has no known allergies.   Review of Systems Review of Systems  Constitutional: Negative for appetite change and fever.  HENT: Negative for congestion.   Respiratory: Negative for shortness of breath.   Cardiovascular: Negative for chest pain.  Gastrointestinal: Positive for nausea. Negative for abdominal distention.  Genitourinary: Negative for dysuria.  Musculoskeletal: Negative for back pain.  Neurological: Positive for headaches.  Psychiatric/Behavioral: Negative for confusion.     Physical Exam Updated Vital Signs BP (!) 161/88 (BP Location: Right Arm)  Pulse 84   Temp 98.5 F (36.9 C) (Oral)   Resp 16   Ht 5\' 4"  (1.626 m)   Wt 76.7 kg   LMP 04/10/2011   SpO2 97%   BMI 29.01 kg/m   Physical Exam HENT:     Head: Normocephalic.  Eyes:     Pupils: Pupils are equal, round, and reactive to light.  Neck:     Musculoskeletal: Neck supple.  Cardiovascular:     Rate and Rhythm: Regular rhythm.  Pulmonary:     Effort: Pulmonary effort is normal.     Breath sounds: Normal breath sounds. No wheezing or rhonchi.  Abdominal:     Tenderness: There is no abdominal tenderness.    Musculoskeletal:        General: No swelling.  Skin:    General: Skin is warm.  Neurological:     Mental Status: She is alert.      ED Treatments / Results  Labs (all labs ordered are listed, but only abnormal results are displayed) Labs Reviewed  COOXEMETRY PANEL    EKG None  Radiology No results found.  Procedures Procedures (including critical care time)  Medications Ordered in ED Medications  acetaminophen (TYLENOL) tablet 500 mg (500 mg Oral Given 02/20/18 1224)     Initial Impression / Assessment and Plan / ED Course  I have reviewed the triage vital signs and the nursing notes.  Pertinent labs & imaging results that were available during my care of the patient were reviewed by me and considered in my medical decision making (see chart for details).     Patient worried about toxic inhalation.  Carbon monoxide level normal.  Not hypoxic.  Discharge home.  Final Clinical Impressions(s) / ED Diagnoses   Final diagnoses:  Inhalation injury    ED Discharge Orders    None       Davonna Belling, MD 02/20/18 1240

## 2018-02-20 NOTE — Discharge Instructions (Addendum)
You have not received a toxic level of carbon monoxide.

## 2018-02-20 NOTE — ED Notes (Signed)
Pt verbalized understanding of discharge instructions and denies any further questions at this time.   

## 2018-02-20 NOTE — ED Triage Notes (Addendum)
Pt was at work and kept smelling fumes that smelled like kerosene, pt is concerned that it was carbon monoxide, denies any dizziness or other symptoms. Pt was sent by PCP who contacted poison control. A&O x4.

## 2018-02-20 NOTE — Progress Notes (Signed)
Called poison control to discuss patient's exposure to possible gases. Spoke with a nurse with poison control of [place] and was told that patient needed to go to the ER due to symptoms and exposure time for stat labs and EKG.   Per nurse with poison control she is going to call [facility] and give report or patient's symptoms.

## 2018-06-17 ENCOUNTER — Ambulatory Visit: Payer: BLUE CROSS/BLUE SHIELD | Admitting: Family Medicine

## 2018-06-17 ENCOUNTER — Other Ambulatory Visit: Payer: Self-pay

## 2018-06-17 ENCOUNTER — Encounter: Payer: Self-pay | Admitting: Family Medicine

## 2018-06-17 VITALS — BP 130/90 | HR 94 | Temp 98.2°F | Resp 16 | Ht 63.0 in | Wt 170.4 lb

## 2018-06-17 DIAGNOSIS — R030 Elevated blood-pressure reading, without diagnosis of hypertension: Secondary | ICD-10-CM | POA: Diagnosis not present

## 2018-06-17 DIAGNOSIS — R079 Chest pain, unspecified: Secondary | ICD-10-CM

## 2018-06-17 NOTE — Patient Instructions (Signed)
F/U 3 weeks for blood pressure check We will call with lab results

## 2018-06-17 NOTE — Progress Notes (Signed)
   Subjective:    Patient ID: Breanna Campbell, female    DOB: November 28, 1965, 53 y.o.   MRN: [id number]  Patient presents for Muscle Pain (Patient has c/o pain in right arm, chest, and left side of chest)  Pain in right arm, with tingling in fingers, then moved across the chest to left side and arm,no radiating pain toneck No N/V, no SOB.  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 She will eat a meal before she gets to work Sleeps on average 4 hours a day  Does not feel the pain currently    Review Of Systems:  GEN- denies fatigue, fever, weight loss,weakness, recent illness HEENT- denies eye drainage, change in vision, nasal discharge, CVS-+chest pain,denies  palpitations RESP- denies SOB, cough, wheeze ABD- denies N/V, change in stools, abd pain GU- denies dysuria, hematuria, dribbling, incontinence MSK- + joint pain,denies  muscle aches, injury Neuro- denies headache, dizziness, syncope, seizure activity       Objective:    BP 130/90   Pulse 94   Temp 98.2 F (36.8 C) (Oral)   Resp 16   Ht 5\' 3"  (1.6 m)   Wt 170 lb 6 oz (77.3 kg)   LMP 04/10/2011   SpO2 99%   BMI 30.18 kg/m  GEN- NAD, alert and oriented x3   142/74 HEENT- PERRL, EOMI, non injected sclera, pink conjunctiva, MMM, oropharynx clear Neck- Supple, no thyromegaly CVS- RRR, no murmur RESP-CTAB ABD-NABS,soft,NT,ND MSK- FROM upper ext, rotator cuff in tact, mild TTP mid left  forearm, no swelling, no erythema  Psych- normal affect andmood  EXT- No edema Pulses- Radial, DP- 2+  EKG- NSR., no ST changes       Assessment & Plan:      Problem List Items Addressed This Visit    None    Visit Diagnoses    Chest pain, unspecified type    -  Primary   Atypical chest pain more musculoskeletal pain she has been under a  lot of stress is very sleep deprived.  Her blood pressure is also a little elevated When have her return in a few weeks to recheck her blood pressure.  I given her tonight office well to rest.  We will check some labs.  Her EKG was completely normal.  However gave her strong precautions she does get severe chest pain with radiation shortness of breath she needs to go to the nearest hospital to be evaluated   Relevant Orders   EKG 12-Lead (Completed)   CBC with Differential/Platelet   Comprehensive metabolic panel   Elevated blood pressure reading       Relevant Orders   EKG 12-Lead (Completed)      Note: This dictation was prepared with Dragon dictation along with smaller phrase technology. Any transcriptional errors that result from this process are unintentional.

## 2018-06-18 LAB — CBC WITH DIFFERENTIAL/PLATELET
Absolute Monocytes: 422 cells/uL (ref 200–950)
Basophils Absolute: 50 cells/uL (ref 0–200)
Basophils Relative: 0.8 %
Eosinophils Absolute: 258 cells/uL (ref 15–500)
Eosinophils Relative: 4.1 %
HCT: 36.3 % (ref 35.0–45.0)
Hemoglobin: 12.1 g/dL (ref 11.7–15.5)
Lymphs Abs: 2892 cells/uL (ref 850–3900)
MCH: 29.5 pg (ref 27.0–33.0)
MCHC: 33.3 g/dL (ref 32.0–36.0)
MCV: 88.5 fL (ref 80.0–100.0)
MPV: 11.2 fL (ref 7.5–12.5)
Monocytes Relative: 6.7 %
Neutro Abs: 2678 cells/uL (ref 1500–7800)
Neutrophils Relative %: 42.5 %
Platelets: 299 10*3/uL (ref 140–400)
RBC: 4.1 10*6/uL (ref 3.80–5.10)
RDW: 11.6 % (ref 11.0–15.0)
Total Lymphocyte: 45.9 %
WBC: 6.3 10*3/uL (ref 3.8–10.8)

## 2018-06-18 LAB — COMPREHENSIVE METABOLIC PANEL
AG Ratio: 1.2 (calc) (ref 1.0–2.5)
ALT: 9 U/L (ref 6–29)
AST: 17 U/L (ref 10–35)
Albumin: 3.8 g/dL (ref 3.6–5.1)
Alkaline phosphatase (APISO): 57 U/L (ref 37–153)
BUN: 11 mg/dL (ref 7–25)
CO2: 21 mmol/L (ref 20–32)
Calcium: 9 mg/dL (ref 8.6–10.4)
Chloride: 103 mmol/L (ref 98–110)
Creat: 0.79 mg/dL (ref 0.50–1.05)
Globulin: 3.3 g/dL (calc) (ref 1.9–3.7)
Glucose, Bld: 107 mg/dL — ABNORMAL HIGH (ref 65–99)
Potassium: 3.7 mmol/L (ref 3.5–5.3)
Sodium: 136 mmol/L (ref 135–146)
Total Bilirubin: 0.4 mg/dL (ref 0.2–1.2)
Total Protein: 7.1 g/dL (ref 6.1–8.1)

## 2018-07-08 ENCOUNTER — Ambulatory Visit (INDEPENDENT_AMBULATORY_CARE_PROVIDER_SITE_OTHER): Payer: BC Managed Care – PPO | Admitting: Family Medicine

## 2018-07-08 ENCOUNTER — Other Ambulatory Visit: Payer: Self-pay

## 2018-07-08 ENCOUNTER — Encounter: Payer: Self-pay | Admitting: Family Medicine

## 2018-07-08 VITALS — BP 138/76 | HR 82 | Temp 98.6°F | Resp 12 | Ht 63.0 in | Wt 172.0 lb

## 2018-07-08 DIAGNOSIS — G5603 Carpal tunnel syndrome, bilateral upper limbs: Secondary | ICD-10-CM

## 2018-07-08 DIAGNOSIS — E6609 Other obesity due to excess calories: Secondary | ICD-10-CM | POA: Diagnosis not present

## 2018-07-08 DIAGNOSIS — R030 Elevated blood-pressure reading, without diagnosis of hypertension: Secondary | ICD-10-CM

## 2018-07-08 DIAGNOSIS — E669 Obesity, unspecified: Secondary | ICD-10-CM | POA: Insufficient documentation

## 2018-07-08 DIAGNOSIS — Z6830 Body mass index (BMI) 30.0-30.9, adult: Secondary | ICD-10-CM

## 2018-07-08 DIAGNOSIS — G5600 Carpal tunnel syndrome, unspecified upper limb: Secondary | ICD-10-CM

## 2018-07-08 HISTORY — DX: Carpal tunnel syndrome, unspecified upper limb: G56.00

## 2018-07-08 NOTE — Progress Notes (Signed)
   Subjective:    Patient ID: Breanna Campbell, female    DOB: 04-29-1965, 52 y.o.   MRN: [id number]  Patient presents for Follow-up (is not fasting)   Pt here to f/u blood pressure, no further chest pain , no SOB Trying to improve sleep and stress   Occ Still has tingling in both hands occurs at work and home, , no numbness, does not drop items, feels she has to shake hands out  Uses pressure hose all day at work   She has labs done which were normal      Review Of Systems:  GEN- denies fatigue, fever, weight loss,weakness, recent illness HEENT- denies eye drainage, change in vision, nasal discharge, CVS- denies chest pain, palpitations RESP- denies SOB, cough, wheeze ABD- denies N/V, change in stools, abd pain GU- denies dysuria, hematuria, dribbling, incontinence MSK- denies joint pain, muscle aches, injury Neuro- denies headache, dizziness, syncope, seizure activity       Objective:    BP 138/76   Pulse 82   Temp 98.6 F (37 C) (Oral)   Resp 12   Ht 5\' 3"  (1.6 m)   Wt 172 lb (78 kg)   LMP 04/10/2011   SpO2 99%   BMI 30.47 kg/m  GEN- NAD, alert and oriented x3 HEENT- PERRL, EOMI, non injected sclera, pink conjunctiva, MMM, oropharynx clear Neck- Supple, neg spurlings, FROM CVS- RRR, no murmur RESP-CTAB ABD-NABS,soft,NT,ND Neuro-CNII-XII in tact, neg phalens, neg tinels, normal grasp, strength equal bilat UE  EXT- No edema Pulses- Radial, DP- 2+        Assessment & Plan:      Problem List Items Addressed This Visit      Unprioritized   Carpal tunnel syndrome    Will give trial of cock-up wrist splints first as her symptoms are not constant.  Next step will be nerve conduction.      Obese    Discussed dietary changes.  Weight has been increasing.  Her blood pressure looks okay today we will continue to monitor in the office and at home.  No new medications       Other Visit Diagnoses    Elevated blood pressure reading    -  Primary      Note:  This dictation was prepared with Dragon dictation along with smaller phrase technology. Any transcriptional errors that result from this process are unintentional.

## 2018-07-08 NOTE — Patient Instructions (Signed)
F/U 6 months for physical Try the wrist braces

## 2018-07-09 ENCOUNTER — Encounter: Payer: Self-pay | Admitting: Family Medicine

## 2018-07-09 NOTE — Assessment & Plan Note (Addendum)
Will give trial of cock-up wrist splints first as her symptoms are not constant.  Next step will be nerve conduction.

## 2018-07-09 NOTE — Assessment & Plan Note (Signed)
Discussed dietary changes.  Weight has been increasing.  Her blood pressure looks okay today we will continue to monitor in the office and at home.  No new medications

## 2018-08-22 ENCOUNTER — Ambulatory Visit (INDEPENDENT_AMBULATORY_CARE_PROVIDER_SITE_OTHER): Payer: BC Managed Care – PPO | Admitting: Family Medicine

## 2018-08-22 ENCOUNTER — Other Ambulatory Visit: Payer: Self-pay

## 2018-08-22 DIAGNOSIS — R030 Elevated blood-pressure reading, without diagnosis of hypertension: Secondary | ICD-10-CM | POA: Diagnosis not present

## 2018-08-22 DIAGNOSIS — R55 Syncope and collapse: Secondary | ICD-10-CM

## 2018-08-22 DIAGNOSIS — R42 Dizziness and giddiness: Secondary | ICD-10-CM | POA: Diagnosis not present

## 2018-08-22 NOTE — Progress Notes (Signed)
Virtual Visit via Telephone Note  I connected with Breanna Campbell on 08/22/18 at  12:04pm by telephone and verified that I am speaking with the correct person using two identifiers.       Pt location: at home   Physician location:  In office, [facility], Vic Blackbird MD     On call: patient and physician   I discussed the limitations, risks, security and privacy concerns of performing an evaluation and management service by telephone and the availability of in person appointments. I also discussed with the patient that there may be a patient responsible charge related to this service. The patient expressed understanding and agreed to proceed.   History of Present Illness: Pt called in for work note. Has been out of  Work for the past 2 days due to dizziness. Needed note to return to work.  Wed AM ( end of her 3rd shift) was at work and all of a sudden heart started beating fast and she felt lightheaded. She went to the back to cool off but still felt faint. Manger walked her to break room, and was able to get out of her uniform and had ice pack on her and felt a little better. Feels like she was overheated at work. She still didnt feel right so stayed at home Thursday night as well.  Today still feels a like something is off and a little dizzy, but states she needs to make money. Does not feel like she has a fever, no thermometer at home I had her check BP while I waited on the phone this was-  152/81  HR 85    Observations/Objective: NAD noted over phone, normal speech   Assessment and Plan: Dizzy episodes- continued symptoms, BP is back up as well, she had elevated BP in office a couple months ago, but then was normal on recheck, likely MTF with BP and overheated. No other symptoms. Advised to push fluids, check BP throughout weekend, go to ER if she gets another severe spell, otherwise out of work until I see her Tuesday for recheck  Elevated blood pressure   Follow  Up Instructions:    I discussed the assessment and treatment plan with the patient. The patient was provided an opportunity to ask questions and all were answered. The patient agreed with the plan and demonstrated an understanding of the instructions.   The patient was advised to call back or seek an in-person evaluation if the symptoms worsen or if the condition fails to improve as anticipated.  I provided 11 minutes of non-face-to-face time during this encounter. End Time: 12:15  Vic Blackbird, MD

## 2018-08-24 ENCOUNTER — Encounter: Payer: Self-pay | Admitting: Family Medicine

## 2018-08-26 ENCOUNTER — Other Ambulatory Visit: Payer: Self-pay

## 2018-08-26 ENCOUNTER — Ambulatory Visit: Payer: Self-pay | Admitting: Family Medicine

## 2018-08-26 ENCOUNTER — Ambulatory Visit: Payer: BC Managed Care – PPO | Admitting: Family Medicine

## 2018-08-26 ENCOUNTER — Encounter: Payer: Self-pay | Admitting: Family Medicine

## 2018-08-26 VITALS — BP 126/68 | HR 78 | Temp 98.9°F | Resp 14 | Ht 63.0 in | Wt 173.0 lb

## 2018-08-26 DIAGNOSIS — R030 Elevated blood-pressure reading, without diagnosis of hypertension: Secondary | ICD-10-CM | POA: Diagnosis not present

## 2018-08-26 DIAGNOSIS — R42 Dizziness and giddiness: Secondary | ICD-10-CM

## 2018-08-26 NOTE — Patient Instructions (Signed)
Check blood once a day and record  We will call with lab results F/U pending results

## 2018-08-26 NOTE — Progress Notes (Signed)
   Subjective:    Patient ID: Breanna Campbell, female    DOB: 10/28/1965, 53 y.o.   MRN: [id number]  Patient presents for Follow-up (BP)   See phone note from 8/14 She felt a little light headed this weendkend, BP was up a little at times, highest  638 systolic, most normal range 118-120's/ 70's  She has noticed some loose stools one day but not diarrhea, no vomiting, no fever   No cough or congeestion  she did try to hydrate   No chest pain or SOB this weekend  Feels better today      Review Of Systems:  GEN- denies fatigue, fever, weight loss,weakness, recent illness HEENT- denies eye drainage, change in vision, nasal discharge, CVS- denies chest pain, palpitations RESP- denies SOB, cough, wheeze ABD- denies N/V, change in stools, abd pain GU- denies dysuria, hematuria, dribbling, incontinence MSK- denies joint pain, muscle aches, injury Neuro- denies headache, dizziness, syncope, seizure activity       Objective:    BP 126/68   Pulse 78   Temp 98.9 F (37.2 C) (Oral)   Resp 14   Ht 5\' 3"  (1.6 m)   Wt 173 lb (78.5 kg)   LMP 04/10/2011   SpO2 98%   BMI 30.65 kg/m  GEN- NAD, alert and oriented x3 HEENT- PERRL, EOMI, non injected sclera, pink conjunctiva, MMM, oropharynx clear Neck- Supple, no thyromegaly CVS- RRR, no murmur RESP-CTAB ABD-NABS,soft,NT,ND NEURO-CNII-XII in tact no focal deficits  EXT- No edema Pulses- Radial, DP- 2+  EKG- NSR , No ST changes done in  June 2020       Assessment & Plan:      Problem List Items Addressed This Visit    None    Visit Diagnoses    Dizzy spells    -  Primary   Normal exam today, BP normal even with the orthopstatics. Check labs again, will have her keep monitoring BP at home, will not start meds as not consistently high Does work in very extreme temperatures.  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  And then she has to wear her regular closed another suit and then some rubber protection  gear I also queery viral illness but if so short lived Will send back to work today    Relevant Orders   Comprehensive metabolic panel   TSH   Elevated blood pressure reading       Relevant Orders   CBC with Differential/Platelet   Comprehensive metabolic panel   TSH      Note: This dictation was prepared with Dragon dictation along with smaller Company secretary. Any transcriptional errors that result from this process are unintentional.

## 2018-08-26 NOTE — Progress Notes (Signed)
Orthostatic Blood Pressure:   BP  HR SpO2 Lying:   126/ 66 76 97  Sitting:  124/ 64 74 97  Standing: 128/ 70 72 98   Stnding x2: 126/ 72 78 98

## 2018-08-27 LAB — COMPREHENSIVE METABOLIC PANEL
AG Ratio: 1.1 (calc) (ref 1.0–2.5)
ALT: 10 U/L (ref 6–29)
AST: 16 U/L (ref 10–35)
Albumin: 4 g/dL (ref 3.6–5.1)
Alkaline phosphatase (APISO): 64 U/L (ref 37–153)
BUN: 9 mg/dL (ref 7–25)
CO2: 22 mmol/L (ref 20–32)
Calcium: 9.6 mg/dL (ref 8.6–10.4)
Chloride: 102 mmol/L (ref 98–110)
Creat: 0.85 mg/dL (ref 0.50–1.05)
Globulin: 3.6 g/dL (calc) (ref 1.9–3.7)
Glucose, Bld: 87 mg/dL (ref 65–99)
Potassium: 3.9 mmol/L (ref 3.5–5.3)
Sodium: 135 mmol/L (ref 135–146)
Total Bilirubin: 0.4 mg/dL (ref 0.2–1.2)
Total Protein: 7.6 g/dL (ref 6.1–8.1)

## 2018-08-27 LAB — CBC WITH DIFFERENTIAL/PLATELET
Absolute Monocytes: 448 cells/uL (ref 200–950)
Basophils Absolute: 32 cells/uL (ref 0–200)
Basophils Relative: 0.5 %
Eosinophils Absolute: 179 cells/uL (ref 15–500)
Eosinophils Relative: 2.8 %
HCT: 36.5 % (ref 35.0–45.0)
Hemoglobin: 12 g/dL (ref 11.7–15.5)
Lymphs Abs: 2803 cells/uL (ref 850–3900)
MCH: 29.6 pg (ref 27.0–33.0)
MCHC: 32.9 g/dL (ref 32.0–36.0)
MCV: 90.1 fL (ref 80.0–100.0)
MPV: 11.1 fL (ref 7.5–12.5)
Monocytes Relative: 7 %
Neutro Abs: 2938 cells/uL (ref 1500–7800)
Neutrophils Relative %: 45.9 %
Platelets: 290 10*3/uL (ref 140–400)
RBC: 4.05 10*6/uL (ref 3.80–5.10)
RDW: 11.6 % (ref 11.0–15.0)
Total Lymphocyte: 43.8 %
WBC: 6.4 10*3/uL (ref 3.8–10.8)

## 2018-08-27 LAB — TSH: TSH: 1.81 mIU/L

## 2018-08-28 ENCOUNTER — Telehealth: Payer: Self-pay | Admitting: *Deleted

## 2018-08-28 NOTE — Telephone Encounter (Signed)
Received call from patient.   Requested prescription for anxiety. States that she feels her anxiety is causing her BP to be elevated as well. Patient was previously on Hydroxyzine PRN.   MD please advise.

## 2018-08-29 MED ORDER — HYDROXYZINE HCL 25 MG PO TABS: 25.0000 mg | ORAL_TABLET | Freq: Three times a day (TID) | ORAL | 1 refills | Status: DC | PRN

## 2018-08-29 NOTE — Telephone Encounter (Signed)
She can try hydroxyzine  25mg  TID prn anxiety  #30 R 1  If this doesn't help call us back

## 2018-08-29 NOTE — Telephone Encounter (Signed)
Call placed to patient and patient made aware.   Prescription sent to pharmacy.  

## 2018-09-15 ENCOUNTER — Ambulatory Visit (HOSPITAL_COMMUNITY)
Admission: EM | Admit: 2018-09-15 | Discharge: 2018-09-15 | Disposition: A | Discharge status: Home / Self Care | Payer: BC Managed Care – PPO | Attending: Family Medicine | Admitting: Family Medicine

## 2018-09-15 ENCOUNTER — Encounter (HOSPITAL_COMMUNITY): Payer: Self-pay

## 2018-09-15 ENCOUNTER — Other Ambulatory Visit: Payer: Self-pay

## 2018-09-15 DIAGNOSIS — Z20828 Contact with and (suspected) exposure to other viral communicable diseases: Secondary | ICD-10-CM | POA: Insufficient documentation

## 2018-09-15 DIAGNOSIS — M791 Myalgia, unspecified site: Secondary | ICD-10-CM | POA: Diagnosis not present

## 2018-09-15 DIAGNOSIS — J029 Acute pharyngitis, unspecified: Secondary | ICD-10-CM | POA: Insufficient documentation

## 2018-09-15 DIAGNOSIS — R6883 Chills (without fever): Secondary | ICD-10-CM

## 2018-09-15 DIAGNOSIS — R6889 Other general symptoms and signs: Secondary | ICD-10-CM | POA: Diagnosis not present

## 2018-09-15 DIAGNOSIS — Z20822 Contact with and (suspected) exposure to covid-19: Secondary | ICD-10-CM

## 2018-09-15 LAB — POCT RAPID STREP A: Streptococcus, Group A Screen (Direct): NEGATIVE

## 2018-09-15 NOTE — ED Provider Notes (Signed)
[place]    CSN: [id number] Arrival date & time: 09/15/18  1159      History   Chief Complaint Chief Complaint  Patient presents with  . Sore  . Fever  . Chills  . Generalized Body Aches    HPI Breanna Campbell is a 53 y.o. female.   Patient presents with a 1 day history of fever, chills, sore throat, body aches.  She denies difficulty swallowing, rash, cough, shortness of breath, vomiting, diarrhea, or other symptoms.  LMP: hysterectomy.  The history is provided by the patient.    Past Medical History:  Diagnosis Date  . Anemia     Patient Active Problem List   Diagnosis Date Noted  . Carpal tunnel syndrome 07/08/2018  . Obese 07/08/2018  . Allergic reaction 03/15/2014  . Anxiety state 10/10/2013  . Abdominal pain 03/06/2013  . Gallstones 01/25/2013  . Vitamin D deficiency 01/25/2013  . Weight loss, unintentional 12/23/2012  . Decreased appetite 12/23/2012  . Encounter for routine gynecological examination 03/25/2012  . Constipation 10/24/2011  . Anemia 10/24/2011  . Fibroid uterus 05/08/2011    Class: Diagnosis of    Past Surgical History:  Procedure Laterality Date  . CHOLECYSTECTOMY N/A 10/26/2013   Procedure: LAPAROSCOPIC CHOLECYSTECTOMY;  Surgeon: Jamesetta So, MD;  Location: [facility];  Service: General;  Laterality: N/A;  . LAPAROSCOPIC SUPRACERVICAL HYSTERECTOMY  2013  . TUBAL LIGATION      OB History   No obstetric history on file.      Home Medications    Prior to Admission medications   Medication Sig Start Date End Date Taking? Authorizing Provider  acyclovir ointment (ZOVIRAX) 5 % Apply 1 application topically every 3 (three) hours. For 4 days Patient not taking: Reported on 08/26/2018 06/17/17   Alycia Rossetti, MD  cholecalciferol (VITAMIN D) 1000 UNITS tablet Take 1,000 Units by mouth daily.    [provider]  hydrOXYzine (ATARAX/VISTARIL) 25 MG tablet Take 1 tablet (25 mg total) by mouth 3 (three) times  daily as needed for anxiety. 08/29/18   Alycia Rossetti, MD    Family History Family History  Problem Relation Age of Onset  . Diverticulitis Mother   . Hypertension Mother   . Hypothyroidism Mother   . Hypertension Father   . Hyperlipidemia Father   . Anesthesia problems Neg Hx   . Hypotension Neg Hx   . Malignant hyperthermia Neg Hx   . Pseudochol deficiency Neg Hx     Social History Social History   Tobacco Use  . Smoking status: Never Smoker  . Smokeless tobacco: Never Used  Substance Use Topics  . Alcohol use: No  . Drug use: No     Allergies   Patient has no known allergies.   Review of Systems Review of Systems  Constitutional: Positive for chills and fever.  HENT: Positive for sore throat. Negative for ear pain and trouble swallowing.   Eyes: Negative for pain and visual disturbance.  Respiratory: Negative for cough and shortness of breath.   Cardiovascular: Negative for chest pain and palpitations.  Gastrointestinal: Negative for abdominal pain and vomiting.  Genitourinary: Negative for dysuria and hematuria.  Musculoskeletal: Negative for arthralgias and back pain.  Skin: Negative for color change and rash.  Neurological: Negative for seizures and syncope.  All other systems reviewed and are negative.    Physical Exam Triage Vital Signs ED Triage Vitals  Enc Vitals Group     BP 09/15/18 1332 139/72  Pulse Rate 09/15/18 1332 (!) 109     Resp --      Temp 09/15/18 1331 99.1 F (37.3 C)     Temp Source 09/15/18 1331 Temporal     SpO2 09/15/18 1332 95 %     Weight --      Height --      Head Circumference --      Peak Flow --      Pain Score 09/15/18 1329 8     Pain Loc --      Pain Edu? --      Excl. in [place]? --    No data found.  Updated Vital Signs BP 139/72 (BP Location: Left Arm)   Pulse (!) 109   Temp 99.1 F (37.3 C) (Temporal)   LMP 04/10/2011   SpO2 95%   Visual Acuity Right Eye Distance:   Left Eye Distance:    Bilateral Distance:    Right Eye Near:   Left Eye Near:    Bilateral Near:     Physical Exam Vitals signs and nursing note reviewed.  Constitutional:      General: She is not in acute distress.    Appearance: She is well-developed.  HENT:     Head: Normocephalic and atraumatic.     Mouth/Throat:     Mouth: Mucous membranes are moist.     Pharynx: Posterior oropharyngeal erythema present. No oropharyngeal exudate.  Eyes:     Conjunctiva/sclera: Conjunctivae normal.  Neck:     Musculoskeletal: Neck supple.  Cardiovascular:     Rate and Rhythm: Normal rate and regular rhythm.     Heart sounds: No murmur.  Pulmonary:     Effort: Pulmonary effort is normal. No respiratory distress.     Breath sounds: Normal breath sounds. No wheezing or rhonchi.  Abdominal:     General: Bowel sounds are normal.     Palpations: Abdomen is soft.     Tenderness: There is no abdominal tenderness. There is no guarding or rebound.  Skin:    General: Skin is warm and dry.     Findings: No rash.  Neurological:     Mental Status: She is alert.      UC Treatments / Results  Labs (all labs ordered are listed, but only abnormal results are displayed) Labs Reviewed  NOVEL CORONAVIRUS, NAA (HOSP ORDER, SEND-OUT TO REF LAB; TAT 18-24 HRS)  CULTURE, GROUP A STREP [facility])  POCT RAPID STREP A    EKG   Radiology No results found.  Procedures Procedures (including critical care time)  Medications Ordered in UC Medications - No data to display  Initial Impression / Assessment and Plan / UC Course  I have reviewed the triage vital signs and the nursing notes.  Pertinent labs & imaging results that were available during my care of the patient were reviewed by me and considered in my medical decision making (see chart for details).     Sore throat, suspected COVID.  Rapid strep negative; which are pending.  COVID test performed here.  Instructed patient to self quarantine until her test results  are back.  Instructed patient to go to the emergency department if she develops difficulty swallowing, high fever, shortness of breath, severe diarrhea, or other concerning symptoms.  Instructed patient to take Tylenol as needed for her fever or discomfort.  Patient agrees with plan of care.     Final Clinical Impressions(s) / UC Diagnoses   Final diagnoses:  Sore throat  Suspected  Covid-19 Virus Infection     Discharge Instructions     Your rapid strep test was negative.  A throat culture is being sent and we will call you if it is positive requiring treatment.    Take Tylenol as needed for your fever.    Your COVID test is pending.  You should self quarantine until your test result is back and is negative.    Go to the emergency department if you develop difficulty swallowing, shortness of breath, high fever, severe diarrhea, or other concerning symptoms.         ED Prescriptions    None     Controlled Substance Prescriptions [place] Controlled Substance Registry consulted? Not Applicable   Sharion Balloon, NP 09/15/18 1402

## 2018-09-15 NOTE — Discharge Instructions (Signed)
Your rapid strep test was negative.  A throat culture is being sent and we will call you if it is positive requiring treatment.    Take Tylenol as needed for your fever.    Your COVID test is pending.  You should self quarantine until your test result is back and is negative.    Go to the emergency department if you develop difficulty swallowing, shortness of breath, high fever, severe diarrhea, or other concerning symptoms.

## 2018-09-15 NOTE — ED Triage Notes (Signed)
Pt started developing chills, sore throat, body aches and fever 1 day ago 09/14/2018.

## 2018-09-16 LAB — NOVEL CORONAVIRUS, NAA (HOSP ORDER, SEND-OUT TO REF LAB; TAT 18-24 HRS): SARS-CoV-2, NAA: NOT DETECTED

## 2018-09-17 ENCOUNTER — Encounter (HOSPITAL_COMMUNITY): Payer: Self-pay

## 2018-09-17 LAB — CULTURE, GROUP A STREP (THRC)

## 2018-11-13 ENCOUNTER — Telehealth: Payer: Self-pay | Admitting: Family Medicine

## 2018-11-13 NOTE — Telephone Encounter (Signed)
Patient dropped off fmla/covid forms to be fill out for her employer  Please call when ready aware of 20.00 charge for forms

## 2018-11-13 NOTE — Telephone Encounter (Signed)
Awaiting forms

## 2018-11-14 NOTE — Telephone Encounter (Signed)
Received FMLA requests for (2) separate encounters.   The first encounter covers 08/20/2018- 08/25/2018 for dizziness and elevated blood pressure. Patient was released on 08/26/2018 to return to work that night per chart notes. The patient was seen via Telehealth on 08/22/2018, and in the office on 08/26/2018.  The second encounter is 09/16/2018- 09/18/2018 for COVID like symptoms (fever/ chills, sore throat, body aches). She was seen at [facility] on 09/15/2018.  MD to be made aware.

## 2018-11-14 NOTE — Telephone Encounter (Signed)
Agree to complete

## 2018-12-24 ENCOUNTER — Encounter: Payer: Self-pay | Admitting: Family Medicine

## 2019-01-12 ENCOUNTER — Encounter: Payer: BC Managed Care – PPO | Admitting: Family Medicine

## 2019-01-26 ENCOUNTER — Encounter: Payer: Self-pay | Admitting: Family Medicine

## 2019-02-04 ENCOUNTER — Encounter: Payer: Self-pay | Admitting: Family Medicine

## 2019-04-08 ENCOUNTER — Encounter: Payer: BC Managed Care – PPO | Admitting: Family Medicine

## 2019-05-20 ENCOUNTER — Ambulatory Visit
Admission: EM | Admit: 2019-05-20 | Discharge: 2019-05-20 | Disposition: A | Discharge status: Home / Self Care | Payer: BC Managed Care – PPO | Attending: Family Medicine | Admitting: Family Medicine

## 2019-05-20 ENCOUNTER — Other Ambulatory Visit: Payer: Self-pay

## 2019-05-20 DIAGNOSIS — R509 Fever, unspecified: Secondary | ICD-10-CM | POA: Diagnosis not present

## 2019-05-20 DIAGNOSIS — R6883 Chills (without fever): Secondary | ICD-10-CM

## 2019-05-20 DIAGNOSIS — R6889 Other general symptoms and signs: Secondary | ICD-10-CM | POA: Diagnosis not present

## 2019-05-20 DIAGNOSIS — R112 Nausea with vomiting, unspecified: Secondary | ICD-10-CM

## 2019-05-20 DIAGNOSIS — K529 Noninfective gastroenteritis and colitis, unspecified: Secondary | ICD-10-CM

## 2019-05-20 DIAGNOSIS — R1084 Generalized abdominal pain: Secondary | ICD-10-CM

## 2019-05-20 DIAGNOSIS — R5383 Other fatigue: Secondary | ICD-10-CM

## 2019-05-20 MED ORDER — LOPERAMIDE HCL 2 MG PO CAPS
2.0000 mg | ORAL_CAPSULE | Freq: Four times a day (QID) | ORAL | 0 refills | Status: DC | PRN
Start: 2019-05-20 — End: 2021-03-01

## 2019-05-20 MED ORDER — ONDANSETRON HCL 4 MG PO TABS
4.0000 mg | ORAL_TABLET | Freq: Four times a day (QID) | ORAL | 0 refills | Status: DC
Start: 2019-05-20 — End: 2021-03-01

## 2019-05-20 NOTE — ED Provider Notes (Signed)
[place]   [id number] 05/20/19 Arrival Time: 0815  CC: ABDOMINAL PAIN  SUBJECTIVE:  Breanna Campbell is a 54 y.o. female who presents with complaint of abdominal discomfort that began abruptly 4 days ago.  Denies a precipitating event, trauma, recent travel or antibiotic use. Reports daughter that lives at home with the same symptoms. Describes as intermittent and dull/achy  in character. Has not tried OTC medications. Denies alleviating or aggravating factors.Denies similar symptoms in the past.  Last BM today. Reports chills, fatigue, nausea, vomiting, diarrhea.  Denies fever, appetite changes, weight changes, chest pain, SOB, constipation, hematochezia, melena, dysuria, difficulty urinating, increased frequency or urgency, flank pain, loss of bowel or bladder function, vaginal discharge, vaginal odor, vaginal bleeding, dyspareunia, pelvic pain.     Patient's last menstrual period was 04/10/2011.  ROS: As per HPI.  All other pertinent ROS negative.     Past Medical History:  Diagnosis Date  . Anemia    Past Surgical History:  Procedure Laterality Date  . CHOLECYSTECTOMY N/A 10/26/2013   Procedure: LAPAROSCOPIC CHOLECYSTECTOMY;  Surgeon: Jamesetta So, MD;  Location: [facility];  Service: General;  Laterality: N/A;  . LAPAROSCOPIC SUPRACERVICAL HYSTERECTOMY  2013  . TUBAL LIGATION     No Known Allergies No current facility-administered medications on file prior to encounter.   Current Outpatient Medications on File Prior to Encounter  Medication Sig Dispense Refill  . acyclovir ointment (ZOVIRAX) 5 % Apply 1 application topically every 3 (three) hours. For 4 days (Patient not taking: Reported on 08/26/2018) 30 g 0  . cholecalciferol (VITAMIN D) 1000 UNITS tablet Take 1,000 Units by mouth daily.    . hydrOXYzine (ATARAX/VISTARIL) 25 MG tablet Take 1 tablet (25 mg total) by mouth 3 (three) times daily as needed for anxiety. 30 tablet 1   Social History   Socioeconomic  History  . Marital status: Married    Spouse name: Not on file  . Number of children: Not on file  . Years of education: Not on file  . Highest education level: Not on file  Occupational History  . Not on file  Tobacco Use  . Smoking status: Never Smoker  . Smokeless tobacco: Never Used  Substance and Sexual Activity  . Alcohol use: No  . Drug use: No  . Sexual activity: Yes    Birth control/protection: Surgical  Other Topics Concern  . Not on file  Social History Narrative  . Not on file   Social Determinants of Health   Financial Resource Strain:   . Difficulty of Paying Living Expenses:   Food Insecurity:   . Worried About Charity fundraiser in the Last Year:   . Arboriculturist in the Last Year:   Transportation Needs:   . Film/video editor (Medical):   Marland Kitchen Lack of Transportation (Non-Medical):   Physical Activity:   . Days of Exercise per Week:   . Minutes of Exercise per Session:   Stress:   . Feeling of Stress :   Social Connections:   . Frequency of Communication with Friends and Family:   . Frequency of Social Gatherings with Friends and Family:   . Attends Religious Services:   . Active Member of Clubs or Organizations:   . Attends Archivist Meetings:   Marland Kitchen Marital Status:   Intimate Partner Violence:   . Fear of Current or Ex-Partner:   . Emotionally Abused:   Marland Kitchen Physically Abused:   . Sexually Abused:  Family History  Problem Relation Age of Onset  . Diverticulitis Mother   . Hypertension Mother   . Hypothyroidism Mother   . Hypertension Father   . Hyperlipidemia Father   . Anesthesia problems Neg Hx   . Hypotension Neg Hx   . Malignant hyperthermia Neg Hx   . Pseudochol deficiency Neg Hx      OBJECTIVE:  Vitals:   05/20/19 0842  BP: 136/85  Pulse: 80  Resp: 16  Temp: 98.4 F (36.9 C)  TempSrc: Oral  SpO2: 99%    General appearance: Alert; NAD HEENT: NCAT.  Oropharynx clear.  Lungs: clear to auscultation  bilaterally without adventitious breath sounds Heart: regular rate and rhythm.  Radial pulses 2+ symmetrical bilaterally Abdomen: soft, non-distended;increased active bowel sounds; non-tender to light and deep palpation; nontender at McBurney's point; negative Murphy's sign; negative rebound; no guarding Back: no CVA tenderness Extremities: no edema; symmetrical with no gross deformities Skin: warm and dry Neurologic: normal gait Psychological: alert and cooperative; normal mood and affect  LABS: No results found for this or any previous visit (from the past 24 hour(s)).  DIAGNOSTIC STUDIES: No results found.   ASSESSMENT & PLAN:  1. Fever, unspecified fever cause   2. Nausea and vomiting, intractability of vomiting not specified, unspecified vomiting type   3. Other fatigue   4. Chills   5. Generalized abdominal pain   6. Noninfectious gastroenteritis, unspecified type     Meds ordered this encounter  Medications  . loperamide (IMODIUM) 2 MG capsule    Sig: Take 1 capsule (2 mg total) by mouth 4 (four) times daily as needed for diarrhea or loose stools.    Dispense:  12 capsule    Refill:  0    Order Specific Question:   Supervising Provider    Answer:   Chase Picket [id number]  . ondansetron (ZOFRAN) 4 MG tablet    Sig: Take 1 tablet (4 mg total) by mouth every 6 (six) hours.    Dispense:  12 tablet    Refill:  0    Order Specific Question:   Supervising Provider    Answer:   Chase Picket [id number]     Get rest and drink fluids Zofran prescribed.  Take as directed.   Loperamide prescribed. Take as directed.  DIET Instructions:  30 minutes after taking nausea medicine, begin with sips of clear liquids. If able to hold down 2 - 4 ounces for 30 minutes, begin drinking more. Increase your fluid intake to replace losses. Clear liquids only for 24 hours (water, tea, sport drinks, clear flat ginger ale or cola and juices, broth, jello, popsicles, ect). Advance to  bland foods, applesauce, rice, baked or boiled chicken, ect. Avoid milk, greasy foods and anything that doesn't agree with you.  If you experience new or worsening symptoms return or go to ER such as fever, chills, nausea, vomiting, diarrhea, bloody or dark tarry stools, constipation, urinary symptoms, worsening abdominal discomfort, symptoms that do not improve with medications, inability to keep fluids down.  Covid swab obtained in office today.  Patient instructed to quarantine until results are back and negative.  If results are negative, patient may resume daily schedule as tolerated once they are fever free for 24 hours without the use of antipyretic medications.  If results are positive, patient instructed to quarantine 10 days from today.  Patient instructed to follow-up with primary care with this office as needed.  Patient instructed to follow-up in the ER  for trouble swallowing, trouble breathing, other concerning symptoms.  Work note provided.  Reviewed expectations re: course of current medical issues. Questions answered. Outlined signs and symptoms indicating need for more acute intervention. Patient verbalized understanding. After Visit Summary given.    Faustino Congress, NP 05/20/19 [phone]

## 2019-05-20 NOTE — Discharge Instructions (Addendum)
Your COVID test is pending.  You should self quarantine until the test result is back.    Take Tylenol as needed for fever or discomfort.  Rest and keep yourself hydrated.    Go to the emergency department if you develop shortness of breath, severe diarrhea, high fever not relieved by Tylenol or ibuprofen, or other concerning symptoms.    

## 2019-05-21 LAB — NOVEL CORONAVIRUS, NAA: SARS-CoV-2, NAA: NOT DETECTED

## 2019-05-21 LAB — SARS-COV-2, NAA 2 DAY TAT

## 2019-09-16 ENCOUNTER — Other Ambulatory Visit: Payer: Self-pay

## 2019-09-16 ENCOUNTER — Ambulatory Visit: Payer: BC Managed Care – PPO | Admitting: Family Medicine

## 2019-09-16 ENCOUNTER — Encounter: Payer: Self-pay | Admitting: Family Medicine

## 2019-09-16 VITALS — BP 128/74 | HR 70 | Temp 98.7°F | Resp 16 | Ht 63.0 in | Wt 177.0 lb

## 2019-09-16 DIAGNOSIS — Z23 Encounter for immunization: Secondary | ICD-10-CM

## 2019-09-16 DIAGNOSIS — M65349 Trigger finger, unspecified ring finger: Secondary | ICD-10-CM | POA: Diagnosis not present

## 2019-09-16 DIAGNOSIS — G5603 Carpal tunnel syndrome, bilateral upper limbs: Secondary | ICD-10-CM

## 2019-09-16 DIAGNOSIS — L84 Corns and callosities: Secondary | ICD-10-CM | POA: Diagnosis not present

## 2019-09-16 MED ORDER — GABAPENTIN 100 MG PO CAPS
ORAL_CAPSULE | ORAL | 3 refills | Status: DC
Start: 2019-09-16 — End: 2021-03-01

## 2019-09-16 NOTE — Patient Instructions (Addendum)
Take Gabapentin at bedtime  F/U as previous

## 2019-09-16 NOTE — Assessment & Plan Note (Signed)
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

## 2019-09-16 NOTE — Progress Notes (Signed)
   Subjective:    Patient ID: Breanna Campbell, female    DOB: [birth year], 54 y.o.   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  she has tried braces, NSAIDS  Topical creams/sprays    Review Of Systems:  GEN- denies fatigue, fever, weight loss,weakness, recent illness HEENT- denies eye drainage, change in vision, nasal discharge, CVS- denies chest pain, palpitations RESP- denies SOB, cough, wheeze ABD- denies N/V, change in stools, abd pain GU- denies dysuria, hematuria, dribbling, incontinence MSK- denies joint pain, muscle aches, injury Neuro- denies headache, dizziness, syncope, seizure activity       Objective:    BP 128/74   Pulse 70   Temp 98.7 F (37.1 C) (Temporal)   Resp 16   Ht 5\' 3"  (1.6 m)   Wt 177 lb (80.3 kg)   LMP 04/10/2011   SpO2 97%   BMI 31.35 kg/m  GEN- NAD, alert and oriented x3 HEENT- PERRL, EOMI, non injected sclera,  Neck- Supple, no thyromegaly CVS- RRR, no murmur RESP-CTAB Neuro- cnii-xii IN TACT, normal monofilament, +phalens, normal grasp bilat, callus bilat thumbs EXT- No edema Pulses- Radial 2+        Assessment & Plan:      Problem List Items Addressed This Visit      Unprioritized   Carpal tunnel syndrome - Primary    Progressive symptoms, due to overuse , squeezes pressure washer This has also lead to callus formation on thumbs and some triggering Referral to hand specialist Add gabapentin start 100mg , can titrate up to 300mg  NSAIDS, topicals, braces no longer helping  Flu shot given      Relevant Medications   gabapentin (NEURONTIN) 100 MG capsule    Other Visit Diagnoses    Skin callus       Trigger ring finger,  unspecified laterality       Need for immunization against influenza       Relevant Orders   Flu Vaccine QUAD 36+ mos IM (Completed)      Note: This dictation was prepared with Dragon dictation along with smaller phrase technology. Any transcriptional errors that result from this process are unintentional.

## 2019-10-09 DIAGNOSIS — M65341 Trigger finger, right ring finger: Secondary | ICD-10-CM | POA: Insufficient documentation

## 2019-10-09 DIAGNOSIS — G5601 Carpal tunnel syndrome, right upper limb: Secondary | ICD-10-CM | POA: Diagnosis not present

## 2019-10-09 DIAGNOSIS — G5602 Carpal tunnel syndrome, left upper limb: Secondary | ICD-10-CM | POA: Diagnosis not present

## 2019-10-09 DIAGNOSIS — G5603 Carpal tunnel syndrome, bilateral upper limbs: Secondary | ICD-10-CM | POA: Diagnosis not present

## 2019-10-09 DIAGNOSIS — M65342 Trigger finger, left ring finger: Secondary | ICD-10-CM | POA: Diagnosis not present

## 2019-10-09 HISTORY — DX: Trigger finger, right ring finger: M65.341

## 2019-10-21 DIAGNOSIS — G5603 Carpal tunnel syndrome, bilateral upper limbs: Secondary | ICD-10-CM | POA: Diagnosis not present

## 2019-11-05 DIAGNOSIS — G5603 Carpal tunnel syndrome, bilateral upper limbs: Secondary | ICD-10-CM | POA: Diagnosis not present

## 2019-11-18 ENCOUNTER — Encounter: Payer: Self-pay | Admitting: Family Medicine

## 2019-11-18 ENCOUNTER — Ambulatory Visit: Payer: BC Managed Care – PPO | Admitting: Family Medicine

## 2019-11-18 ENCOUNTER — Other Ambulatory Visit: Payer: Self-pay

## 2019-11-18 ENCOUNTER — Telehealth: Payer: Self-pay | Admitting: *Deleted

## 2019-11-18 VITALS — BP 130/68 | HR 72 | Temp 98.2°F | Resp 14 | Ht 63.0 in | Wt 176.0 lb

## 2019-11-18 DIAGNOSIS — E6609 Other obesity due to excess calories: Secondary | ICD-10-CM

## 2019-11-18 DIAGNOSIS — Z1231 Encounter for screening mammogram for malignant neoplasm of breast: Secondary | ICD-10-CM

## 2019-11-18 DIAGNOSIS — Z0001 Encounter for general adult medical examination with abnormal findings: Secondary | ICD-10-CM

## 2019-11-18 DIAGNOSIS — Z13220 Encounter for screening for lipoid disorders: Secondary | ICD-10-CM | POA: Diagnosis not present

## 2019-11-18 DIAGNOSIS — Z6831 Body mass index (BMI) 31.0-31.9, adult: Secondary | ICD-10-CM

## 2019-11-18 DIAGNOSIS — Z1159 Encounter for screening for other viral diseases: Secondary | ICD-10-CM

## 2019-11-18 DIAGNOSIS — Z136 Encounter for screening for cardiovascular disorders: Secondary | ICD-10-CM | POA: Diagnosis not present

## 2019-11-18 DIAGNOSIS — F411 Generalized anxiety disorder: Secondary | ICD-10-CM

## 2019-11-18 DIAGNOSIS — E559 Vitamin D deficiency, unspecified: Secondary | ICD-10-CM | POA: Diagnosis not present

## 2019-11-18 DIAGNOSIS — Z0000 Encounter for general adult medical examination without abnormal findings: Secondary | ICD-10-CM

## 2019-11-18 MED ORDER — HYDROXYZINE HCL 25 MG PO TABS: 25.0000 mg | ORAL_TABLET | Freq: Three times a day (TID) | ORAL | 1 refills | Status: DC | PRN

## 2019-11-18 NOTE — Progress Notes (Signed)
   Subjective:    Patient ID: Breanna Campbell, female    DOB: 04-20-1965, 54 y.o.   MRN: [id number]  Patient presents for Annual Exam (is not fasting) For complete physical exam.  Medications reviewed Due for mammogram PAP Smear UTD  Immunizations- TDAP/ COVID-19/ flu utd  Due for Colonoscopy  Due for Hep C screening  She continues on gabapentin for her carpal tunnel , she was referred to hand specialist at last visit . She is scheduled for carpal tunnel surgery on right hand on Nov 18th   GAD- she continues on atarax for anxiety, it helps a lot, things are stressful at home, her daughter and grandchild stay with her  Vitamin D def- on 1000IU supplement    She ate chicken salad sandwhich 2 hours before visit     Review Of Systems:  GEN- denies fatigue, fever, weight loss,weakness, recent illness HEENT- denies eye drainage, change in vision, nasal discharge, CVS- denies chest pain, palpitations RESP- denies SOB, cough, wheeze ABD- denies N/V, change in stools, abd pain GU- denies dysuria, hematuria, dribbling, incontinence MSK- denies joint pain, muscle aches, injury Neuro- denies headache, dizziness, syncope, seizure activity       Objective:    BP 130/68   Pulse 72   Temp 98.2 F (36.8 C) (Temporal)   Resp 14   Ht 5\' 3"  (1.6 m)   Wt 176 lb (79.8 kg)   LMP 04/10/2011   SpO2 98%   BMI 31.18 kg/m  GEN- NAD, alert and oriented x3 HEENT- PERRL, EOMI, non injected sclera, pink conjunctiva, MMM, oropharynx clear Neck- Supple, no thyromegaly CVS- RRR, no murmur RESP-CTAB ABD-NABS,soft,NT,ND Psych normal affect and mood EXT- No edema Pulses- Radial, DP- 2+        Assessment & Plan:      Problem List Items Addressed This Visit      Unprioritized   Anxiety state    Continue as needed hydroxyzine      Relevant Medications   hydrOXYzine (ATARAX/VISTARIL) 25 MG tablet   Obese   Vitamin D deficiency    Vitamin D levels continue with replacement.       Relevant Orders   Vitamin D, 25-hydroxy    Other Visit Diagnoses    Routine general medical examination at a health care facility    -  Primary   CPE done.  Patient is schedule mammogram.  Nonfasting labs obtained she will need lipid panel the next time she has blood drawn in a fasting state.  Cologuard for colon cancer screening to be ordered. Patient is medically cleared for surgery.   Relevant Orders   Comprehensive metabolic panel   CBC with Differential/Platelet   Lipid panel   TSH   Need for hepatitis C screening test       Relevant Orders   Hepatitis C antibody   Encounter for screening mammogram for malignant neoplasm of breast       Relevant Orders   MM 3D SCREEN BREAST BILATERAL      Note: This dictation was prepared with Dragon dictation along with smaller phrase technology. Any transcriptional errors that result from this process are unintentional.

## 2019-11-18 NOTE — Assessment & Plan Note (Signed)
- 

## 2019-11-18 NOTE — Patient Instructions (Addendum)
[facility] your mammogram  Cologuard to be done for colon cancer screening F/U 1 year for Physical

## 2019-11-18 NOTE — Telephone Encounter (Signed)
-----   Message from Alycia Rossetti, MD sent at 11/18/2019 11:08 AM EST ----- Regarding: send cologuard

## 2019-11-18 NOTE — Telephone Encounter (Signed)
Received verbal orders for Cologuard.   Order placed via [facility].   Cologuard (Order [id number])

## 2019-11-18 NOTE — Assessment & Plan Note (Signed)
Vitamin D levels continue with replacement.

## 2019-11-19 LAB — TSH: TSH: 2.53 mIU/L

## 2019-11-19 LAB — COMPREHENSIVE METABOLIC PANEL
AG Ratio: 1.2 (calc) (ref 1.0–2.5)
ALT: 12 U/L (ref 6–29)
AST: 16 U/L (ref 10–35)
Albumin: 3.8 g/dL (ref 3.6–5.1)
Alkaline phosphatase (APISO): 76 U/L (ref 37–153)
BUN: 11 mg/dL (ref 7–25)
CO2: 22 mmol/L (ref 20–32)
Calcium: 9.2 mg/dL (ref 8.6–10.4)
Chloride: 104 mmol/L (ref 98–110)
Creat: 0.78 mg/dL (ref 0.50–1.05)
Globulin: 3.3 g/dL (calc) (ref 1.9–3.7)
Glucose, Bld: 101 mg/dL — ABNORMAL HIGH (ref 65–99)
Potassium: 3.6 mmol/L (ref 3.5–5.3)
Sodium: 137 mmol/L (ref 135–146)
Total Bilirubin: 0.3 mg/dL (ref 0.2–1.2)
Total Protein: 7.1 g/dL (ref 6.1–8.1)

## 2019-11-19 LAB — CBC WITH DIFFERENTIAL/PLATELET
Absolute Monocytes: 578 cells/uL (ref 200–950)
Basophils Absolute: 62 cells/uL (ref 0–200)
Basophils Relative: 0.8 %
Eosinophils Absolute: 239 cells/uL (ref 15–500)
Eosinophils Relative: 3.1 %
HCT: 34.7 % — ABNORMAL LOW (ref 35.0–45.0)
Hemoglobin: 11.6 g/dL — ABNORMAL LOW (ref 11.7–15.5)
Lymphs Abs: 2965 cells/uL (ref 850–3900)
MCH: 29.4 pg (ref 27.0–33.0)
MCHC: 33.4 g/dL (ref 32.0–36.0)
MCV: 87.8 fL (ref 80.0–100.0)
MPV: 10.8 fL (ref 7.5–12.5)
Monocytes Relative: 7.5 %
Neutro Abs: 3858 cells/uL (ref 1500–7800)
Neutrophils Relative %: 50.1 %
Platelets: 302 10*3/uL (ref 140–400)
RBC: 3.95 10*6/uL (ref 3.80–5.10)
RDW: 11.3 % (ref 11.0–15.0)
Total Lymphocyte: 38.5 %
WBC: 7.7 10*3/uL (ref 3.8–10.8)

## 2019-11-19 LAB — LIPID PANEL
Cholesterol: 177 mg/dL (ref <200)
HDL: 40 mg/dL — ABNORMAL LOW (ref >=50)
LDL Cholesterol (Calc): 103 mg/dL (calc) — ABNORMAL HIGH
Non-HDL Cholesterol (Calc): 137 mg/dL (calc) — ABNORMAL HIGH (ref <130)
Total CHOL/HDL Ratio: 4.4 (calc) (ref <5.0)
Triglycerides: 219 mg/dL — ABNORMAL HIGH (ref <150)

## 2019-11-19 LAB — VITAMIN D 25 HYDROXY (VIT D DEFICIENCY, FRACTURES): Vit D, 25-Hydroxy: 22 ng/mL — ABNORMAL LOW (ref 30–100)

## 2019-11-19 LAB — HEPATITIS C ANTIBODY
Hepatitis C Ab: NONREACTIVE
SIGNAL TO CUT-OFF: 0.01 (ref <1.00)

## 2019-11-20 ENCOUNTER — Other Ambulatory Visit: Payer: Self-pay | Admitting: *Deleted

## 2019-11-20 DIAGNOSIS — E781 Pure hyperglyceridemia: Secondary | ICD-10-CM

## 2019-11-20 DIAGNOSIS — E559 Vitamin D deficiency, unspecified: Secondary | ICD-10-CM

## 2019-11-20 DIAGNOSIS — Z13220 Encounter for screening for lipoid disorders: Secondary | ICD-10-CM

## 2019-11-20 MED ORDER — VITAMIN D (ERGOCALCIFEROL) 1.25 MG (50000 UNIT) PO CAPS: 50000.0000 [IU] | ORAL_CAPSULE | ORAL | 0 refills | Status: DC

## 2019-11-26 DIAGNOSIS — G5601 Carpal tunnel syndrome, right upper limb: Secondary | ICD-10-CM | POA: Diagnosis not present

## 2019-12-08 DIAGNOSIS — Z1211 Encounter for screening for malignant neoplasm of colon: Secondary | ICD-10-CM | POA: Diagnosis not present

## 2019-12-08 DIAGNOSIS — Z1212 Encounter for screening for malignant neoplasm of rectum: Secondary | ICD-10-CM | POA: Diagnosis not present

## 2019-12-11 DIAGNOSIS — G5601 Carpal tunnel syndrome, right upper limb: Secondary | ICD-10-CM | POA: Diagnosis not present

## 2019-12-14 ENCOUNTER — Ambulatory Visit (HOSPITAL_COMMUNITY)
Admission: RE | Admit: 2019-12-14 | Discharge: 2019-12-14 | Disposition: A | Discharge status: Home / Self Care | Payer: BC Managed Care – PPO | Source: Ambulatory Visit | Attending: Family Medicine | Admitting: Family Medicine

## 2019-12-14 ENCOUNTER — Other Ambulatory Visit: Payer: Self-pay

## 2019-12-14 DIAGNOSIS — Z1231 Encounter for screening mammogram for malignant neoplasm of breast: Secondary | ICD-10-CM | POA: Insufficient documentation

## 2019-12-15 LAB — COLOGUARD
COLOGUARD: NEGATIVE
Cologuard: NEGATIVE

## 2019-12-15 LAB — EXTERNAL GENERIC LAB PROCEDURE: COLOGUARD: NEGATIVE

## 2019-12-31 ENCOUNTER — Telehealth: Payer: Self-pay | Admitting: Family Medicine

## 2019-12-31 NOTE — Telephone Encounter (Signed)
Patient left vm on Christina's vm stating she was returning someone's call. I don't see where anyone has called patient. I tried calling her back to see who left a message however I had to leave a message.  CB# [phone]

## 2020-03-01 DIAGNOSIS — G5603 Carpal tunnel syndrome, bilateral upper limbs: Secondary | ICD-10-CM | POA: Diagnosis not present

## 2021-03-01 ENCOUNTER — Other Ambulatory Visit: Payer: Self-pay

## 2021-03-01 ENCOUNTER — Encounter: Payer: Self-pay | Admitting: Internal Medicine

## 2021-03-01 ENCOUNTER — Ambulatory Visit (INDEPENDENT_AMBULATORY_CARE_PROVIDER_SITE_OTHER): Payer: BC Managed Care – PPO | Admitting: Internal Medicine

## 2021-03-01 VITALS — BP 132/86 | HR 85 | Resp 18 | Ht 64.0 in | Wt 178.8 lb

## 2021-03-01 DIAGNOSIS — E559 Vitamin D deficiency, unspecified: Secondary | ICD-10-CM

## 2021-03-01 DIAGNOSIS — Z0001 Encounter for general adult medical examination with abnormal findings: Secondary | ICD-10-CM | POA: Insufficient documentation

## 2021-03-01 DIAGNOSIS — G5603 Carpal tunnel syndrome, bilateral upper limbs: Secondary | ICD-10-CM

## 2021-03-01 DIAGNOSIS — S46812A Strain of other muscles, fascia and tendons at shoulder and upper arm level, left arm, initial encounter: Secondary | ICD-10-CM | POA: Diagnosis not present

## 2021-03-01 NOTE — Assessment & Plan Note (Signed)
She has had carpal tunnel release surgery on right side Followed by Hand surgery

## 2021-03-01 NOTE — Patient Instructions (Addendum)
Please continue to take vitamins.  Please continue to follow heart healthy diet and perform moderate exercise/walking at least 150 mins/week.  Please consider getting Shingrix vaccine.

## 2021-03-01 NOTE — Assessment & Plan Note (Signed)
Takes Vitamin D supplement 

## 2021-03-01 NOTE — Assessment & Plan Note (Signed)
Neck pain since yesterday likely due to strain of trapezius muscle Heating pad and Tylenol PRN for now

## 2021-03-01 NOTE — Assessment & Plan Note (Signed)

## 2021-03-01 NOTE — Progress Notes (Addendum)
New Patient Office Visit  Subjective:  Patient ID: Breanna Campbell, female    DOB: 1965/08/09  Age: 56 y.o. MRN: [id number]  CC:  Chief Complaint  Patient presents with   New Patient (Initial Visit)    New patient was seeing dr Buelah Manis pt feels pain her back of her neck since yesterday     HPI Breanna Campbell is a 56 y.o. female with past medical history of carpal tunnel syndrome who presents for establishing care.  She has been doing well overall. She has been taking Multivitamin supplement.  She c/o left sided neck pain since yesterday, which is worse with lateral flexion. Denies any injury. Denies any tingling or weakness of UE.  She wants to wait for Shingrix vaccine for now.   Past Medical History:  Diagnosis Date   Anemia    Fibroid uterus 05/08/2011   12- 14 weeks size, removed via abdominal supracervical hysterectomy    Past Surgical History:  Procedure Laterality Date   CHOLECYSTECTOMY N/A 10/26/2013   Procedure: LAPAROSCOPIC CHOLECYSTECTOMY;  Surgeon: Jamesetta So, MD;  Location: [facility];  Service: General;  Laterality: N/A;   LAPAROSCOPIC SUPRACERVICAL HYSTERECTOMY  2013   TUBAL LIGATION      Family History  Problem Relation Age of Onset   Diverticulitis Mother    Hypertension Mother    Hypothyroidism Mother    Hypertension Father    Hyperlipidemia Father    Anesthesia problems Neg Hx    Hypotension Neg Hx    Malignant hyperthermia Neg Hx    Pseudochol deficiency Neg Hx     Social History   Socioeconomic History   Marital status: Married    Spouse name: Not on file   Number of children: Not on file   Years of education: Not on file   Highest education level: Not on file  Occupational History   Not on file  Tobacco Use   Smoking status: Never   Smokeless tobacco: Never  Substance and Sexual Activity   Alcohol use: No   Drug use: No   Sexual activity: Yes    Birth control/protection: Surgical  Other Topics Concern   Not on file   Social History Narrative   Not on file   Social Determinants of Health   Financial Resource Strain: Not on file  Food Insecurity: Not on file  Transportation Needs: Not on file  Physical Activity: Not on file  Stress: Not on file  Social Connections: Not on file  Intimate Partner Violence: Not on file    ROS Review of Systems  Constitutional:  Negative for chills and fever.  HENT:  Negative for congestion, sinus pressure, sinus pain and sore throat.   Eyes:  Negative for pain and discharge.  Respiratory:  Negative for cough and shortness of breath.   Cardiovascular:  Negative for chest pain and palpitations.  Gastrointestinal:  Positive for constipation. Negative for abdominal pain, diarrhea, nausea and vomiting.  Endocrine: Negative for polydipsia and polyuria.  Genitourinary:  Negative for dysuria and hematuria.  Musculoskeletal:  Positive for neck pain. Negative for neck stiffness.  Skin:  Negative for rash.  Neurological:  Negative for dizziness and weakness.  Psychiatric/Behavioral:  Negative for agitation and behavioral problems.    Objective:   Today's Vitals: BP 132/86 (BP Location: Left Arm, Patient Position: Sitting, Cuff Size: Normal)    Pulse 85    Resp 18    Ht 5\' 4"  (1.626 m)    Wt 178 lb 12.8  oz (81.1 kg)    LMP 04/10/2011    SpO2 99%    BMI 30.69 kg/m   Physical Exam Vitals reviewed.  Constitutional:      General: She is not in acute distress.    Appearance: She is not diaphoretic.  HENT:     Head: Normocephalic and atraumatic.     Nose: Nose normal.     Mouth/Throat:     Mouth: Mucous membranes are moist.  Eyes:     General: No scleral icterus.    Extraocular Movements: Extraocular movements intact.  Neck:     Comments: Lateral flexion painful Cardiovascular:     Rate and Rhythm: Normal rate and regular rhythm.     Pulses: Normal pulses.     Heart sounds: Normal heart sounds. No murmur heard. Pulmonary:     Breath sounds: Normal breath sounds.  No wheezing or rales.  Abdominal:     Palpations: Abdomen is soft.     Tenderness: There is no abdominal tenderness.  Musculoskeletal:     Cervical back: Neck supple.     Right lower leg: No edema.     Left lower leg: No edema.  Skin:    General: Skin is warm.     Findings: No rash.  Neurological:     General: No focal deficit present.     Mental Status: She is alert and oriented to person, place, and time.  Psychiatric:        Mood and Affect: Mood normal.        Behavior: Behavior normal.    Assessment & Plan:   Encounter for general adult medical examination with abnormal findings Physical exam as documented. Counseling done  re healthy lifestyle involving commitment to 150 minutes exercise per week, heart healthy diet, and attaining healthy weight.The importance of adequate sleep also discussed. Changes in health habits are decided on by the patient with goals and time frames  set for achieving them. Immunization and cancer screening needs are specifically addressed at this visit.  Carpal tunnel syndrome She has had carpal tunnel release surgery on right side Followed by Hand surgery  Strain of left trapezius muscle Neck pain since yesterday likely due to strain of trapezius muscle Heating pad and Tylenol PRN for now  Vitamin D deficiency Takes Vitamin D supplement    Outpatient Encounter Medications as of 03/01/2021  Medication Sig   cholecalciferol (VITAMIN D) 1000 UNITS tablet Take 1,000 Units by mouth daily.   Multiple Vitamin (MULTIVITAMIN WITH MINERALS) TABS tablet Take 1 tablet by mouth daily.   [DISCONTINUED] loperamide (IMODIUM) 2 MG capsule Take 1 capsule (2 mg total) by mouth 4 (four) times daily as needed for diarrhea or loose stools.   [DISCONTINUED] gabapentin (NEURONTIN) 100 MG capsule Take 1-3 capsules at bedtime (Patient not taking: Reported on 03/01/2021)   [DISCONTINUED] hydrOXYzine (ATARAX/VISTARIL) 25 MG tablet Take 1 tablet (25 mg total) by mouth  3 (three) times daily as needed for anxiety. (Patient not taking: Reported on 03/01/2021)   [DISCONTINUED] ondansetron (ZOFRAN) 4 MG tablet Take 1 tablet (4 mg total) by mouth every 6 (six) hours. (Patient not taking: Reported on 03/01/2021)   [DISCONTINUED] Vitamin D, Ergocalciferol, (DRISDOL) 1.25 MG (50000 UNIT) CAPS capsule Take 1 capsule (50,000 Units total) by mouth every 7 (seven) days. x12 weeks, then D/C (Patient not taking: Reported on 03/01/2021)   No facility-administered encounter medications on file as of 03/01/2021.    Follow-up: Return in about 1 year (around 03/01/2022) for Annual  physical.   Lindell Spar, MD

## 2021-03-02 LAB — CMP14+EGFR
ALT: 16 IU/L (ref 0–32)
AST: 24 IU/L (ref 0–40)
Albumin/Globulin Ratio: 1.3 (ref 1.2–2.2)
Albumin: 4.4 g/dL (ref 3.8–4.9)
Alkaline Phosphatase: 86 IU/L (ref 44–121)
BUN/Creatinine Ratio: 15 (ref 9–23)
BUN: 12 mg/dL (ref 6–24)
Bilirubin Total: 0.4 mg/dL (ref 0.0–1.2)
CO2: 22 mmol/L (ref 20–29)
Calcium: 9.7 mg/dL (ref 8.7–10.2)
Chloride: 102 mmol/L (ref 96–106)
Creatinine, Ser: 0.79 mg/dL (ref 0.57–1.00)
Globulin, Total: 3.4 g/dL (ref 1.5–4.5)
Glucose: 101 mg/dL — ABNORMAL HIGH (ref 70–99)
Potassium: 4.1 mmol/L (ref 3.5–5.2)
Sodium: 139 mmol/L (ref 134–144)
Total Protein: 7.8 g/dL (ref 6.0–8.5)
eGFR: 89 mL/min/{1.73_m2} (ref >59)

## 2021-03-02 LAB — CBC WITH DIFFERENTIAL/PLATELET
Basophils Absolute: 0.1 10*3/uL (ref 0.0–0.2)
Basos: 1 %
EOS (ABSOLUTE): 0.3 10*3/uL (ref 0.0–0.4)
Eos: 4 %
Hematocrit: 36.5 % (ref 34.0–46.6)
Hemoglobin: 12.2 g/dL (ref 11.1–15.9)
Immature Grans (Abs): 0 10*3/uL (ref 0.0–0.1)
Immature Granulocytes: 0 %
Lymphocytes Absolute: 2.7 10*3/uL (ref 0.7–3.1)
Lymphs: 41 %
MCH: 29.1 pg (ref 26.6–33.0)
MCHC: 33.4 g/dL (ref 31.5–35.7)
MCV: 87 fL (ref 79–97)
Monocytes Absolute: 0.5 10*3/uL (ref 0.1–0.9)
Monocytes: 7 %
Neutrophils Absolute: 3.1 10*3/uL (ref 1.4–7.0)
Neutrophils: 47 %
Platelets: 322 10*3/uL (ref 150–450)
RBC: 4.19 x10E6/uL (ref 3.77–5.28)
RDW: 11.6 % — ABNORMAL LOW (ref 11.7–15.4)
WBC: 6.6 10*3/uL (ref 3.4–10.8)

## 2021-03-02 LAB — HEMOGLOBIN A1C
Est. average glucose Bld gHb Est-mCnc: 111 mg/dL
Hgb A1c MFr Bld: 5.5 % (ref 4.8–5.6)

## 2021-03-02 LAB — LIPID PANEL
Chol/HDL Ratio: 4.3 ratio (ref 0.0–4.4)
Cholesterol, Total: 190 mg/dL (ref 100–199)
HDL: 44 mg/dL (ref >39)
LDL Chol Calc (NIH): 115 mg/dL — ABNORMAL HIGH (ref 0–99)
Triglycerides: 176 mg/dL — ABNORMAL HIGH (ref 0–149)
VLDL Cholesterol Cal: 31 mg/dL (ref 5–40)

## 2021-03-02 LAB — TSH: TSH: 3.03 u[IU]/mL (ref 0.450–4.500)

## 2021-03-02 LAB — VITAMIN D 25 HYDROXY (VIT D DEFICIENCY, FRACTURES): Vit D, 25-Hydroxy: 22.7 ng/mL — ABNORMAL LOW (ref 30.0–100.0)

## 2021-03-03 ENCOUNTER — Telehealth: Payer: Self-pay | Admitting: Internal Medicine

## 2021-03-03 NOTE — Telephone Encounter (Signed)
Called pt advised of lab results pt verbalized understanding

## 2021-03-03 NOTE — Telephone Encounter (Signed)
Pt returning call in regard to lab results

## 2021-03-28 ENCOUNTER — Ambulatory Visit: Payer: BC Managed Care – PPO | Admitting: Nurse Practitioner

## 2021-03-28 ENCOUNTER — Encounter: Payer: Self-pay | Admitting: Nurse Practitioner

## 2021-03-28 ENCOUNTER — Other Ambulatory Visit: Payer: Self-pay

## 2021-03-28 VITALS — BP 163/86 | HR 74 | Temp 98.2°F | Ht 64.0 in | Wt 179.4 lb

## 2021-03-28 DIAGNOSIS — E785 Hyperlipidemia, unspecified: Secondary | ICD-10-CM

## 2021-03-28 DIAGNOSIS — E559 Vitamin D deficiency, unspecified: Secondary | ICD-10-CM | POA: Diagnosis not present

## 2021-03-28 DIAGNOSIS — Z7689 Persons encountering health services in other specified circumstances: Secondary | ICD-10-CM

## 2021-03-28 NOTE — Progress Notes (Signed)
?  Subjective:  ? ? Patient ID: Breanna Campbell, female    DOB: [birth year], 56 y.o.   MRN: [id number] ? ?HPI ? ?56 year old female here with minimal significant medical history presents to clinic today to establish care.  Patient states she has no no complaints. ? ?Patient states that she was started on vitamin D for low vitamin D level in February.  Patient states that she is continuing to take medication without difficulty. ? ? ?Review of Systems  ?All other systems reviewed and are negative. ? ?   ?Objective:  ? Physical Exam ?Constitutional:   ?   General: She is not in acute distress. ?   Appearance: Normal appearance. She is obese. She is not ill-appearing, toxic-appearing or diaphoretic.  ?Cardiovascular:  ?   Rate and Rhythm: Normal rate and regular rhythm.  ?   Pulses: Normal pulses.  ?   Heart sounds: Normal heart sounds. No murmur heard. ?Pulmonary:  ?   Effort: Pulmonary effort is normal. No respiratory distress.  ?   Breath sounds: Normal breath sounds. No wheezing.  ?Musculoskeletal:     ?   General: Normal range of motion.  ?Skin: ?   General: Skin is warm.  ?   Capillary Refill: Capillary refill takes less than 2 seconds.  ?Neurological:  ?   General: No focal deficit present.  ?   Mental Status: She is alert and oriented to person, place, and time.  ?Psychiatric:     ?   Mood and Affect: Mood normal.     ?   Behavior: Behavior normal.  ? ? ? ? ? ?   ?Assessment & Plan:  ? ?1. Encounter to establish care ?-Return to clinic in about 4 weeks for physical, Pap, and cholesterol follow-up ? ?2. Vitamin D deficiency ?-Continue taking vitamin D at 1000 units daily. ?-We will recheck vitamin D to determine if vitamin D supplementation is still necessary. ?- Vitamin D (25 hydroxy) ?-Follow-up in 4 weeks ? ?3. Hyperlipidemia, unspecified hyperlipidemia type ?-Last lipid profile drawn in February was slightly elevated.  ASCVD 10 year risk score 7.9%.  Statin indicated. ?-We will recheck lipid to verify results  and if statin still indicated we will start statin. ?- Lipid Profile ?-Follow-up in 4 weeks ? ?

## 2021-03-28 NOTE — Addendum Note (Signed)
Addended by: Claire Shown on: 03/28/2021 11:13 AM ? ? Modules accepted: Level of Service ? ?

## 2021-03-29 LAB — LIPID PANEL
Chol/HDL Ratio: 4.6 ratio — ABNORMAL HIGH (ref 0.0–4.4)
Cholesterol, Total: 194 mg/dL (ref 100–199)
HDL: 42 mg/dL (ref >39)
LDL Chol Calc (NIH): 113 mg/dL — ABNORMAL HIGH (ref 0–99)
Triglycerides: 224 mg/dL — ABNORMAL HIGH (ref 0–149)
VLDL Cholesterol Cal: 39 mg/dL (ref 5–40)

## 2021-03-29 LAB — VITAMIN D 25 HYDROXY (VIT D DEFICIENCY, FRACTURES): Vit D, 25-Hydroxy: 27.3 ng/mL — ABNORMAL LOW (ref 30.0–100.0)

## 2021-03-30 ENCOUNTER — Other Ambulatory Visit: Payer: Self-pay | Admitting: Nurse Practitioner

## 2021-03-30 DIAGNOSIS — E785 Hyperlipidemia, unspecified: Secondary | ICD-10-CM

## 2021-03-30 MED ORDER — ROSUVASTATIN CALCIUM 5 MG PO TABS: 5.0000 mg | ORAL_TABLET | Freq: Every day | ORAL | 3 refills | Status: DC

## 2021-04-25 ENCOUNTER — Ambulatory Visit (INDEPENDENT_AMBULATORY_CARE_PROVIDER_SITE_OTHER): Payer: BC Managed Care – PPO | Admitting: Nurse Practitioner

## 2021-04-25 ENCOUNTER — Encounter: Payer: Self-pay | Admitting: Nurse Practitioner

## 2021-04-25 ENCOUNTER — Ambulatory Visit (HOSPITAL_COMMUNITY)
Admission: RE | Admit: 2021-04-25 | Discharge: 2021-04-25 | Disposition: A | Discharge status: Home / Self Care | Payer: BC Managed Care – PPO | Source: Ambulatory Visit | Attending: Nurse Practitioner | Admitting: Nurse Practitioner

## 2021-04-25 VITALS — BP 124/86 | HR 78 | Ht 64.0 in | Wt 181.2 lb

## 2021-04-25 DIAGNOSIS — M25561 Pain in right knee: Secondary | ICD-10-CM | POA: Insufficient documentation

## 2021-04-25 DIAGNOSIS — Z124 Encounter for screening for malignant neoplasm of cervix: Secondary | ICD-10-CM | POA: Diagnosis not present

## 2021-04-25 DIAGNOSIS — E785 Hyperlipidemia, unspecified: Secondary | ICD-10-CM

## 2021-04-25 DIAGNOSIS — E559 Vitamin D deficiency, unspecified: Secondary | ICD-10-CM

## 2021-04-25 DIAGNOSIS — Z01419 Encounter for gynecological examination (general) (routine) without abnormal findings: Secondary | ICD-10-CM

## 2021-04-25 NOTE — Progress Notes (Signed)
? ?Subjective:  ? ? Patient ID: Breanna Campbell, female    DOB: 1965/07/05, 56 y.o.   MRN: [id number] ? ?HPI ? ?Patient arrives for a follow up on cholesterol and a PAP smear- Patient had physical 02/2021 but it did not include a PAP.  Patient denies any vaginal issues.  Patient denies vaginal discharge, vaginal odor, dysuria, or vaginal burning. ? ?Patient has history of a partial supracervical hysterectomy for history of uterine fibroids in 2013.  ? ?Patient also has concerns that her right knee has been hurting intermittently over the past month patient states that her knee will hurt worse in the morning and will improve after stretching her knee out and will continue to improve gradually during the day.  Patient takes Aleve and aspirin which helps.  Patient denies any injury to the knee.   ? ?Review of Systems  ?Musculoskeletal:   ?     Right knee pain  ?All other systems reviewed and are negative. ? ?   ?Objective:  ? Physical Exam ?Vitals reviewed. Exam conducted with a chaperone present.  ?Constitutional:   ?   General: She is not in acute distress. ?   Appearance: Normal appearance. She is obese. She is not ill-appearing, toxic-appearing or diaphoretic.  ?Cardiovascular:  ?   Rate and Rhythm: Normal rate and regular rhythm.  ?   Pulses: Normal pulses.  ?   Heart sounds: Normal heart sounds. No murmur heard. ?Pulmonary:  ?   Effort: Pulmonary effort is normal. No respiratory distress.  ?   Breath sounds: Normal breath sounds. No wheezing.  ?Chest:  ?Breasts: ?   Breasts are symmetrical.  ?   Right: Normal. No swelling, bleeding, inverted nipple, mass, nipple discharge, skin change or tenderness.  ?   Left: Normal. No swelling, bleeding, inverted nipple, mass, nipple discharge, skin change or tenderness.  ?Abdominal:  ?   General: Abdomen is flat. Bowel sounds are normal. There is no distension.  ?   Palpations: Abdomen is soft. There is no mass.  ?   Tenderness: There is no abdominal tenderness. There is no  guarding or rebound.  ?   Hernia: No hernia is present. There is no hernia in the left inguinal area or right inguinal area.  ?Genitourinary: ?   Exam position: Lithotomy position.  ?   Pubic Area: No rash or pubic lice.   ?   Labia:     ?   Right: No rash, tenderness, lesion or injury.     ?   Left: No rash, tenderness or lesion.   ?   Urethra: No prolapse, urethral pain, urethral swelling or urethral lesion.  ?   Vagina: Normal. No signs of injury and foreign body. No vaginal discharge, erythema, tenderness, bleeding, lesions or prolapsed vaginal walls.  ?   Cervix: No cervical motion tenderness, discharge, friability, lesion, erythema, cervical bleeding or eversion.  ?   Uterus: Absent.   ?   Rectum: Normal.  ?   Comments: Cervical os closed. ? ?Adnexa not palpated due to habitus. ?Musculoskeletal:     ?   General: No swelling or tenderness.  ?   Cervical back: Normal range of motion and neck supple. No rigidity or tenderness.  ?   Comments: Grossly intact.  No swelling or joint tenderness noted to right knee.  ?Lymphadenopathy:  ?   Cervical: No cervical adenopathy.  ?   Upper Body:  ?   Right upper body: No supraclavicular, axillary or pectoral  adenopathy.  ?   Left upper body: No supraclavicular, axillary or pectoral adenopathy.  ?   Lower Body: No right inguinal adenopathy. No left inguinal adenopathy.  ?Skin: ?   General: Skin is warm.  ?   Capillary Refill: Capillary refill takes less than 2 seconds.  ?Neurological:  ?   Mental Status: She is alert.  ?   Comments: Grossly intact  ?Psychiatric:     ?   Mood and Affect: Mood normal.     ?   Behavior: Behavior normal.  ? ? ?   ?Assessment & Plan:  ? ?1. Women's annual routine gynecological examination ?Adult wellness-complete.wellness physical was conducted today. Importance of diet and exercise were discussed in detail.  ?In addition to this a discussion regarding safety was also covered. We also reviewed over immunizations and gave recommendations regarding  current immunization needed for age.  ?In addition to this additional areas were also touched on including: ?Preventative health exams needed: ? ?Colonoscopy up to date ?Mammogram up to date ?Cologuard up to date ?Tetanus up to date ?Shingrix deferred ?COVID vaccine #3 deferred ? ?Patient was advised yearly wellness exam  ? ?2. Screening for cervical cancer ?- IGP, Aptima HPV ? ?3. Vitamin D deficiency ?- Last vitamin D was 27.3.  Patient continues to take vitamin D as prescribed. ?- We will recheck vitamin D today ?- Vitamin D (25 hydroxy) ? ?4. Hyperlipidemia, unspecified hyperlipidemia type ?-Last LDL was 113.  Goal is less than 100, not met ?-Patient was started on rosuvastatin 5 mg and is tolerating well. ?-We will recheck lipid panel and CMP today ? ?5. Acute pain of right knee ?-Possibly arthritis. ?- DG Knee Complete 4 Views Right ? ?  ?Note:  This document was prepared using Dragon voice recognition software and may include unintentional dictation errors. ? ? ?

## 2021-04-26 LAB — VITAMIN D 25 HYDROXY (VIT D DEFICIENCY, FRACTURES): Vit D, 25-Hydroxy: 29.3 ng/mL — ABNORMAL LOW (ref 30.0–100.0)

## 2021-04-28 LAB — IGP, APTIMA HPV: HPV Aptima: NEGATIVE

## 2021-05-30 ENCOUNTER — Ambulatory Visit: Payer: BC Managed Care – PPO | Admitting: Nurse Practitioner

## 2022-03-01 ENCOUNTER — Encounter: Payer: BC Managed Care – PPO | Admitting: Internal Medicine

## 2022-04-06 IMAGING — MG DIGITAL SCREENING BILAT W/ TOMO W/ CAD
8 of 14 series · 8 of 40 positions shown · non-contrast
Comparison: Previous exam(s).

CLINICAL DATA: Screening.

EXAM:
DIGITAL SCREENING BILATERAL MAMMOGRAM WITH TOMO AND CAD

[R CC synth-2D (1 of 2)]
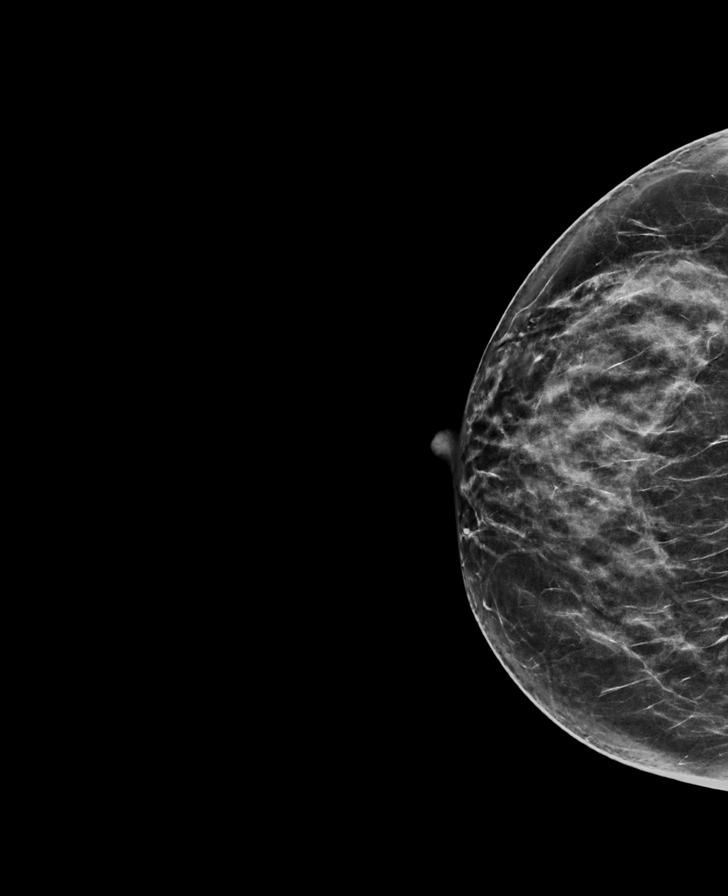

[L MLO synth-2D (1 of 2)]
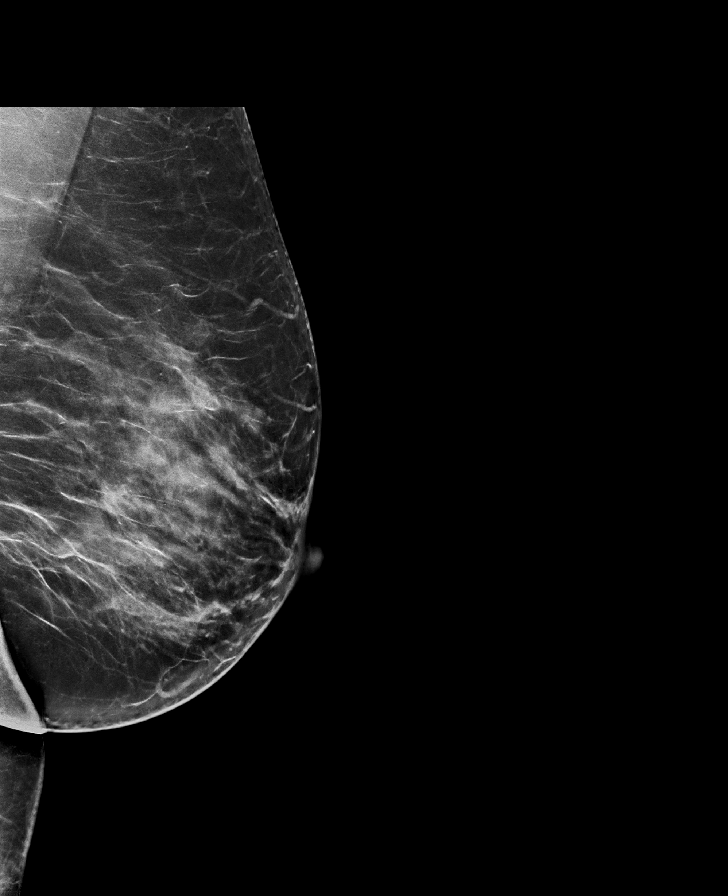

[R CC synth-2D (2 of 2)]
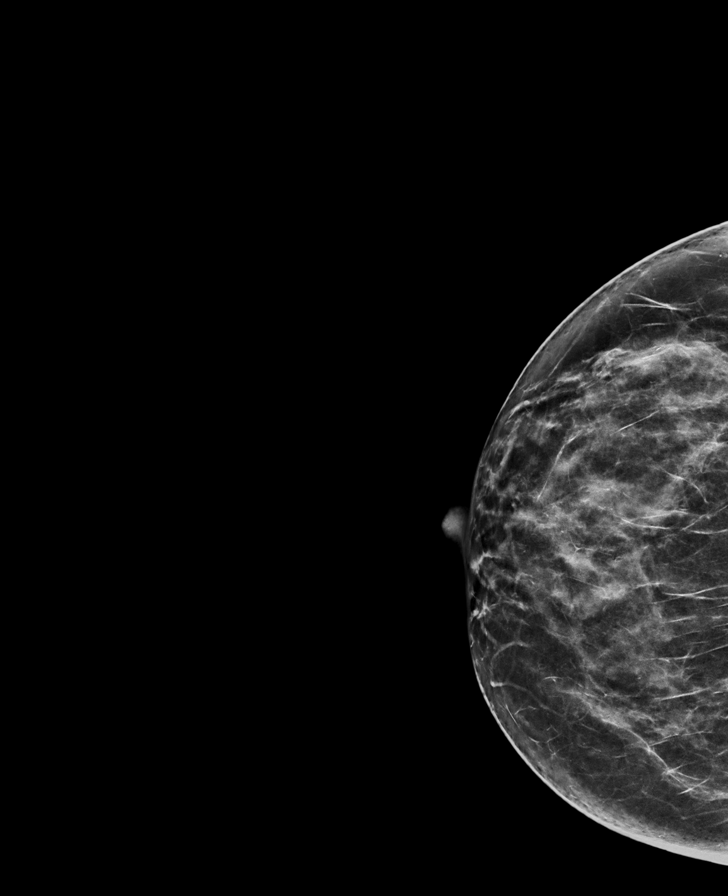

[R MLO synth-2D (1 of 2)]
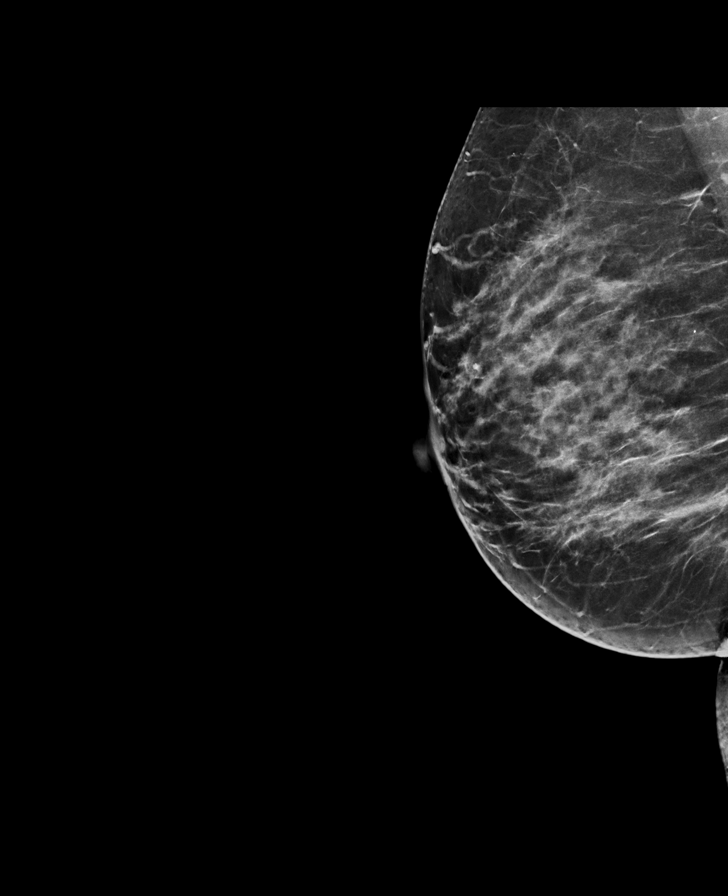

[L CC synth-2D]
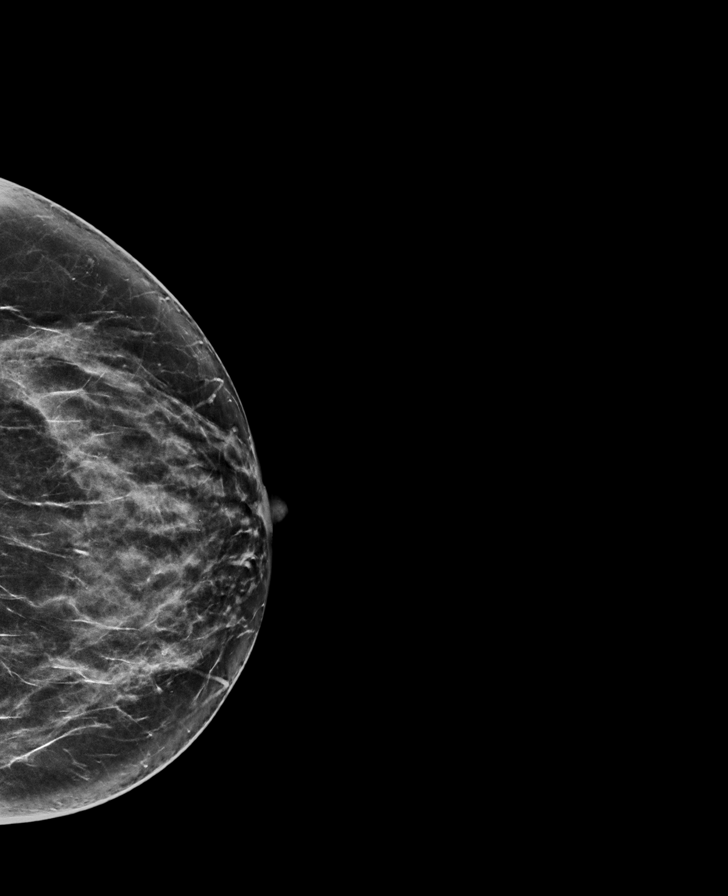

[L MLO synth-2D (2 of 2)]
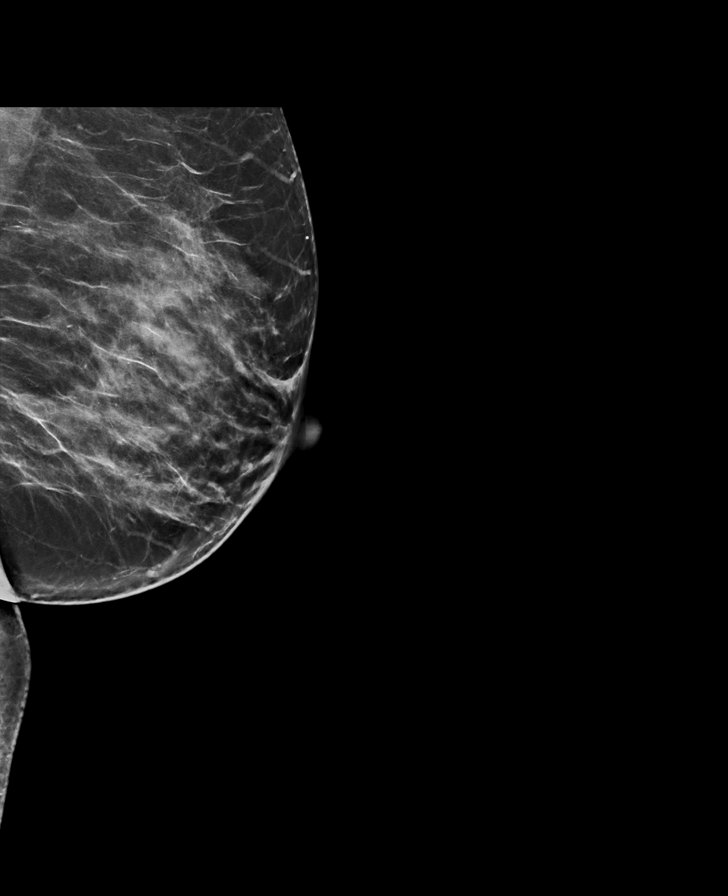

[R MLO synth-2D (2 of 2)]
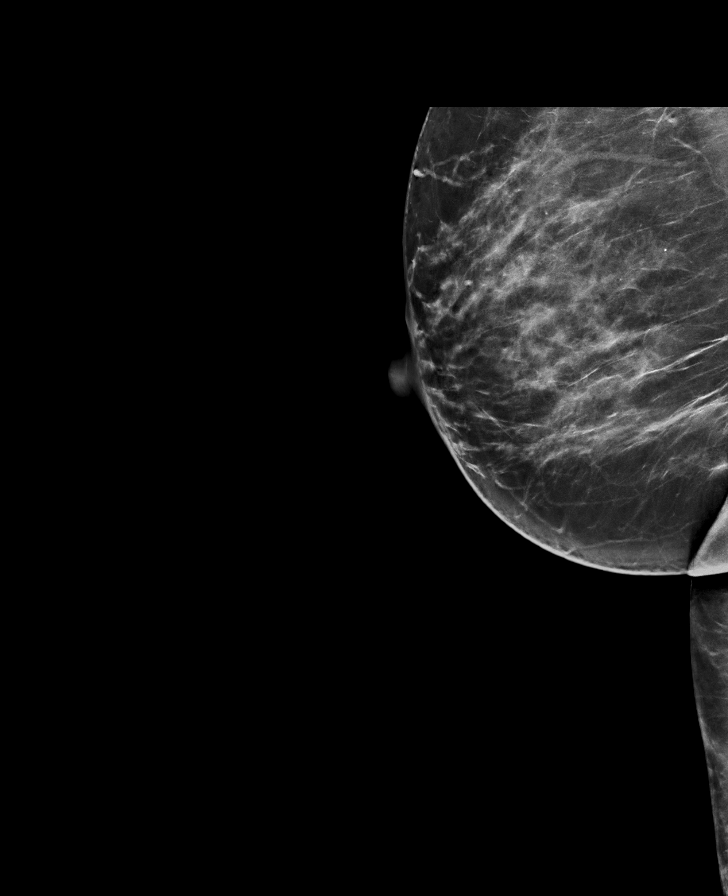

[R CC tomo · tomo slice 37/72.0]
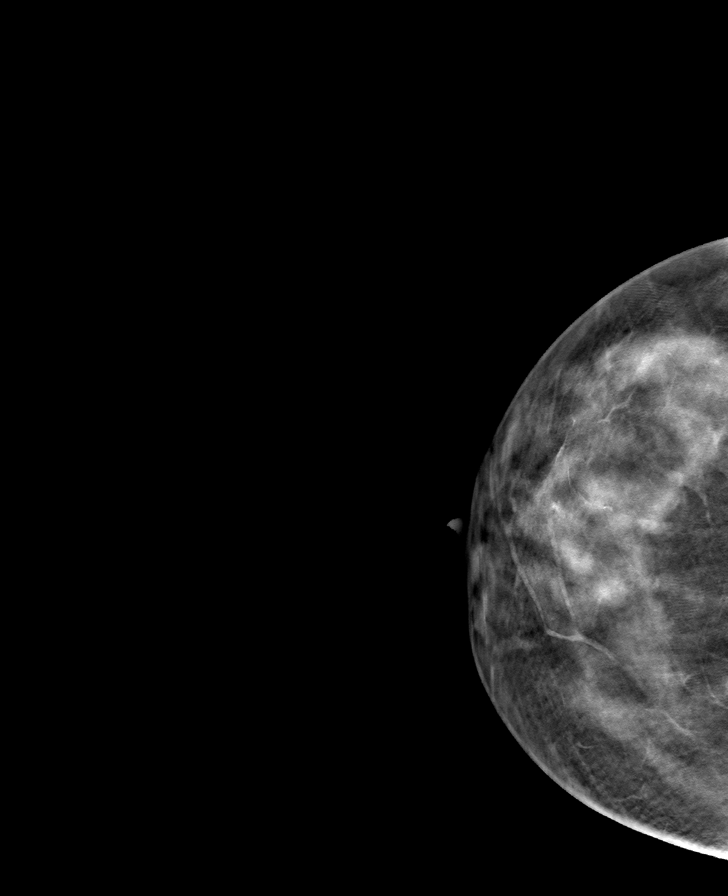

[8 of 40 positions shown; findings below may reference images not displayed]

ACR Breast Density Category c: The breast tissue is heterogeneously
dense, which may obscure small masses.
FINDINGS: There are no findings suspicious for malignancy. Images were
processed with CAD.
IMPRESSION: No mammographic evidence of malignancy. A result letter of this
screening mammogram will be mailed directly to the patient.

RECOMMENDATION:
Screening mammogram in one year. (Code:FT-U-LHB)

BI-RADS CATEGORY  1: Negative.

## 2022-11-16 ENCOUNTER — Ambulatory Visit: Payer: BC Managed Care – PPO | Admitting: Internal Medicine

## 2022-11-16 ENCOUNTER — Encounter: Payer: Self-pay | Admitting: Internal Medicine

## 2022-11-16 VITALS — BP 148/86 | HR 74 | Temp 98.0°F | Ht 64.0 in | Wt 187.6 lb

## 2022-11-16 DIAGNOSIS — E782 Mixed hyperlipidemia: Secondary | ICD-10-CM | POA: Diagnosis not present

## 2022-11-16 DIAGNOSIS — E66811 Obesity, class 1: Secondary | ICD-10-CM | POA: Insufficient documentation

## 2022-11-16 DIAGNOSIS — Z1329 Encounter for screening for other suspected endocrine disorder: Secondary | ICD-10-CM

## 2022-11-16 DIAGNOSIS — Z131 Encounter for screening for diabetes mellitus: Secondary | ICD-10-CM

## 2022-11-16 DIAGNOSIS — E785 Hyperlipidemia, unspecified: Secondary | ICD-10-CM | POA: Insufficient documentation

## 2022-11-16 DIAGNOSIS — E559 Vitamin D deficiency, unspecified: Secondary | ICD-10-CM

## 2022-11-16 DIAGNOSIS — Z1231 Encounter for screening mammogram for malignant neoplasm of breast: Secondary | ICD-10-CM | POA: Insufficient documentation

## 2022-11-16 DIAGNOSIS — N951 Menopausal and female climacteric states: Secondary | ICD-10-CM | POA: Insufficient documentation

## 2022-11-16 DIAGNOSIS — Z23 Encounter for immunization: Secondary | ICD-10-CM | POA: Insufficient documentation

## 2022-11-16 DIAGNOSIS — Z1211 Encounter for screening for malignant neoplasm of colon: Secondary | ICD-10-CM | POA: Insufficient documentation

## 2022-11-16 NOTE — Assessment & Plan Note (Signed)
Family medical history significant for breast cancer in mother.  Due for repeat mammogram.  This has been ordered today.

## 2022-11-16 NOTE — Assessment & Plan Note (Signed)
Cologuard last completed in November 2021.  Repeat Cologuard ordered today.

## 2022-11-16 NOTE — Assessment & Plan Note (Signed)
Her acute concern today is vasomotor symptoms that are likely attributable to menopause.  We discussed starting medication for symptom relief.  Will check basic labs first to screen for additional etiologies.  Consider starting [place] pending lab results.

## 2022-11-16 NOTE — Assessment & Plan Note (Signed)
BMI 32.2.  Last modifications aimed at weight loss were reviewed today.  Screening labs ordered.

## 2022-11-16 NOTE — Assessment & Plan Note (Signed)
Previously documented history of vitamin D deficiency.  Reports that she is on daily vitamin D supplementation. -Repeat vitamin D level ordered today

## 2022-11-16 NOTE — Assessment & Plan Note (Signed)
Influenza vaccine administered today.

## 2022-11-16 NOTE — Progress Notes (Signed)
Established Office Visit  Subjective    Patient ID: Breanna Campbell, female    DOB: [birth year]  Age: 57 y.o. MRN: [id number]  CC:  Chief Complaint  Patient presents with   New Patient (Initial Visit)    HPI Breanna Campbell presents to re-establish care.  She is a 57 year old woman who endorses a past medical history significant for hyperlipidemia and vitamin D deficiency.  Most recently followed by [facility] but was previously seen by Dr. Allena Katz as a new patient to [facility] in February 2023.  Ms. Taffe reports feeling well today.  Her acute concern is periodic vasomotor symptoms that she believes is attributable to menopause.  She is interested in medication options for symptom relief.  She does not have any additional concerns to discuss.  She currently works in Air traffic controller at [employer].  She denies tobacco, alcohol, and illicit drug use.  Her family medical history is significant for hyperlipidemia and breast cancer in her mother.  Acute concerns, chronic medical conditions, and outstanding preventative care items discussed today are individually addressed in A/P below.   Outpatient Encounter Medications as of 11/16/2022  Medication Sig   cholecalciferol (VITAMIN D) 1000 UNITS tablet Take 1,000 Units by mouth daily.   Multiple Vitamin (MULTIVITAMIN WITH MINERALS) TABS tablet Take 1 tablet by mouth daily.   [DISCONTINUED] rosuvastatin (CRESTOR) 5 MG tablet Take 1 tablet (5 mg total) by mouth daily. (Patient not taking: Reported on 11/16/2022)   No facility-administered encounter medications on file as of 11/16/2022.    Past Medical History:  Diagnosis Date   Acquired trigger finger of right ring finger 10/09/2019   Anemia    Carpal tunnel syndrome 07/08/2018   Fibroid uterus 05/08/2011   12- 14 weeks size, removed via abdominal supracervical hysterectomy    Past Surgical History:  Procedure Laterality Date   CHOLECYSTECTOMY N/A 10/26/2013   Procedure: LAPAROSCOPIC  CHOLECYSTECTOMY;  Surgeon: Dalia Heading, MD;  Location: [facility];  Service: General;  Laterality: N/A;   LAPAROSCOPIC SUPRACERVICAL HYSTERECTOMY  2013   TUBAL LIGATION      Family History  Problem Relation Age of Onset   Diverticulitis Mother    Hypertension Mother    Hypothyroidism Mother    Hypertension Father    Hyperlipidemia Father    Anesthesia problems Neg Hx    Hypotension Neg Hx    Malignant hyperthermia Neg Hx    Pseudochol deficiency Neg Hx     Social History   Socioeconomic History   Marital status: Married    Spouse name: Not on file   Number of children: Not on file   Years of education: Not on file   Highest education level: Not on file  Occupational History   Not on file  Tobacco Use   Smoking status: Never   Smokeless tobacco: Never  Substance and Sexual Activity   Alcohol use: No   Drug use: No   Sexual activity: Yes    Birth control/protection: Surgical  Other Topics Concern   Not on file  Social History Narrative   Not on file   Social Determinants of Health   Financial Resource Strain: Not on file  Food Insecurity: Not on file  Transportation Needs: Not on file  Physical Activity: Not on file  Stress: Not on file  Social Connections: Not on file  Intimate Partner Violence: Not on file   Review of Systems  Constitutional:  Negative for chills and fever.  HENT:  Negative for sore  throat.   Respiratory:  Negative for cough and shortness of breath.   Cardiovascular:  Negative for chest pain, palpitations and leg swelling.  Gastrointestinal:  Negative for abdominal pain, blood in stool, constipation, diarrhea, nausea and vomiting.  Genitourinary:  Negative for dysuria and hematuria.  Musculoskeletal:  Negative for myalgias.  Skin:  Negative for itching and rash.  Neurological:  Negative for dizziness and headaches.  Endo/Heme/Allergies:        + / - vasomotor symptoms  Psychiatric/Behavioral:  Negative for depression and suicidal ideas.     Objective    BP (!) 148/86   Pulse 74   Temp 98 F (36.7 C) (Oral)   Ht 5\' 4"  (1.626 m)   Wt 187 lb 9.6 oz (85.1 kg)   LMP 04/10/2011   SpO2 96%   BMI 32.20 kg/m   Physical Exam Vitals reviewed.  Constitutional:      General: She is not in acute distress.    Appearance: Normal appearance. She is obese. She is not toxic-appearing.  HENT:     Head: Normocephalic and atraumatic.     Right Ear: External ear normal.     Left Ear: External ear normal.     Nose: Nose normal. No congestion or rhinorrhea.     Mouth/Throat:     Mouth: Mucous membranes are moist.     Pharynx: Oropharynx is clear. No oropharyngeal exudate or posterior oropharyngeal erythema.  Eyes:     General: No scleral icterus.    Extraocular Movements: Extraocular movements intact.     Conjunctiva/sclera: Conjunctivae normal.     Pupils: Pupils are equal, round, and reactive to light.  Cardiovascular:     Rate and Rhythm: Normal rate and regular rhythm.     Pulses: Normal pulses.     Heart sounds: Normal heart sounds. No murmur heard.    No friction rub. No gallop.  Pulmonary:     Effort: Pulmonary effort is normal.     Breath sounds: Normal breath sounds. No wheezing, rhonchi or rales.  Abdominal:     General: Abdomen is flat. Bowel sounds are normal. There is no distension.     Palpations: Abdomen is soft.     Tenderness: There is no abdominal tenderness.  Musculoskeletal:        General: No swelling. Normal range of motion.     Cervical back: Normal range of motion.     Right lower leg: No edema.     Left lower leg: No edema.  Lymphadenopathy:     Cervical: No cervical adenopathy.  Skin:    General: Skin is warm and dry.     Capillary Refill: Capillary refill takes less than 2 seconds.     Coloration: Skin is not jaundiced.  Neurological:     General: No focal deficit present.     Mental Status: She is alert and oriented to person, place, and time.  Psychiatric:        Mood and Affect: Mood  normal.        Behavior: Behavior normal.   Last CBC Lab Results  Component Value Date   WBC 6.6 03/01/2021   HGB 12.2 03/01/2021   HCT 36.5 03/01/2021   MCV 87 03/01/2021   MCH 29.1 03/01/2021   RDW 11.6 (L) 03/01/2021   PLT 322 03/01/2021   Last metabolic panel Lab Results  Component Value Date   GLUCOSE 101 (H) 03/01/2021   NA 139 03/01/2021   K 4.1 03/01/2021   CL  102 03/01/2021   CO2 22 03/01/2021   BUN 12 03/01/2021   CREATININE 0.79 03/01/2021   EGFR 89 03/01/2021   CALCIUM 9.7 03/01/2021   PROT 7.8 03/01/2021   ALBUMIN 4.4 03/01/2021   LABGLOB 3.4 03/01/2021   AGRATIO 1.3 03/01/2021   BILITOT 0.4 03/01/2021   ALKPHOS 86 03/01/2021   AST 24 03/01/2021   ALT 16 03/01/2021   ANIONGAP 11 10/22/2013   Last lipids Lab Results  Component Value Date   CHOL 194 03/28/2021   HDL 42 03/28/2021   LDLCALC 113 (H) 03/28/2021   TRIG 224 (H) 03/28/2021   CHOLHDL 4.6 (H) 03/28/2021   Last hemoglobin A1c Lab Results  Component Value Date   HGBA1C 5.5 03/01/2021   Last thyroid functions Lab Results  Component Value Date   TSH 3.030 03/01/2021   Last vitamin D Lab Results  Component Value Date   VD25OH 29.3 (L) 04/25/2021   Assessment & Plan:   Problem List Items Addressed This Visit       Vitamin D deficiency    Previously documented history of vitamin D deficiency.  Reports that she is on daily vitamin D supplementation. -Repeat vitamin D level ordered today      Hyperlipidemia    Lipid panel last updated in March 2023.  Total cholesterol 194 and LDL 113.  She has most recently been prescribed rosuvastatin 5 mg daily but reports not taking it. -Repeat lipid panel ordered today.      Obesity (BMI 30.0-34.9)    BMI 32.2.  Last modifications aimed at weight loss were reviewed today.  Screening labs ordered.      Need for influenza vaccination    Influenza vaccine administered today      Vasomotor symptoms due to menopause    Her acute concern  today is vasomotor symptoms that are likely attributable to menopause.  We discussed starting medication for symptom relief.  Will check basic labs first to screen for additional etiologies.  Consider starting Ve[place] pending lab results.      Breast cancer screening by mammogram - Primary    Family medical history significant for breast cancer in mother.  Due for repeat mammogram.  This has been ordered today.      Colon cancer screening    Cologuard last completed in November 2021.  Repeat Cologuard ordered today.      Return in about 6 months (around 05/16/2023).   Billie Lade, MD

## 2022-11-16 NOTE — Patient Instructions (Signed)
It was a pleasure to see you today.  Thank you for giving [place] the opportunity to be involved in your care.  Below is a brief recap of your visit and next steps.  We will plan to see you again in 6 months.  Summary You have established care today No medication changes were made Basic labs ordered Mammogram ordered Flu shot today Follow up in 6 months

## 2022-11-16 NOTE — Assessment & Plan Note (Signed)
Lipid panel last updated in March 2023.  Total cholesterol 194 and LDL 113.  She has most recently been prescribed rosuvastatin 5 mg daily but reports not taking it. -Repeat lipid panel ordered today.

## 2022-11-17 LAB — CMP14+EGFR
ALT: 21 [IU]/L (ref 0–32)
AST: 23 [IU]/L (ref 0–40)
Albumin: 4.2 g/dL (ref 3.8–4.9)
Alkaline Phosphatase: 108 [IU]/L (ref 44–121)
BUN/Creatinine Ratio: 16 (ref 9–23)
BUN: 12 mg/dL (ref 6–24)
Bilirubin Total: 0.2 mg/dL (ref 0.0–1.2)
CO2: 20 mmol/L (ref 20–29)
Calcium: 9.5 mg/dL (ref 8.7–10.2)
Chloride: 102 mmol/L (ref 96–106)
Creatinine, Ser: 0.74 mg/dL (ref 0.57–1.00)
Globulin, Total: 3.5 g/dL (ref 1.5–4.5)
Glucose: 96 mg/dL (ref 70–99)
Potassium: 3.6 mmol/L (ref 3.5–5.2)
Sodium: 139 mmol/L (ref 134–144)
Total Protein: 7.7 g/dL (ref 6.0–8.5)
eGFR: 95 mL/min/{1.73_m2} (ref >59)

## 2022-11-17 LAB — VITAMIN D 25 HYDROXY (VIT D DEFICIENCY, FRACTURES): Vit D, 25-Hydroxy: 31.5 ng/mL (ref 30.0–100.0)

## 2022-11-17 LAB — TSH+FREE T4
Free T4: 1.13 ng/dL (ref 0.82–1.77)
TSH: 2.82 u[IU]/mL (ref 0.450–4.500)

## 2022-11-17 LAB — CBC WITH DIFFERENTIAL/PLATELET
Basophils Absolute: 0.1 10*3/uL (ref 0.0–0.2)
Basos: 1 %
EOS (ABSOLUTE): 0.3 10*3/uL (ref 0.0–0.4)
Eos: 4 %
Hematocrit: 35.9 % (ref 34.0–46.6)
Hemoglobin: 11.6 g/dL (ref 11.1–15.9)
Immature Grans (Abs): 0 10*3/uL (ref 0.0–0.1)
Immature Granulocytes: 0 %
Lymphocytes Absolute: 3.2 10*3/uL — ABNORMAL HIGH (ref 0.7–3.1)
Lymphs: 46 %
MCH: 29.1 pg (ref 26.6–33.0)
MCHC: 32.3 g/dL (ref 31.5–35.7)
MCV: 90 fL (ref 79–97)
Monocytes Absolute: 0.5 10*3/uL (ref 0.1–0.9)
Monocytes: 7 %
Neutrophils Absolute: 3.1 10*3/uL (ref 1.4–7.0)
Neutrophils: 42 %
Platelets: 289 10*3/uL (ref 150–450)
RBC: 3.99 x10E6/uL (ref 3.77–5.28)
RDW: 11.8 % (ref 11.7–15.4)
WBC: 7.2 10*3/uL (ref 3.4–10.8)

## 2022-11-17 LAB — HEMOGLOBIN A1C
Est. average glucose Bld gHb Est-mCnc: 120 mg/dL
Hgb A1c MFr Bld: 5.8 % — ABNORMAL HIGH (ref 4.8–5.6)

## 2022-11-17 LAB — LIPID PANEL
Chol/HDL Ratio: 5 ratio — ABNORMAL HIGH (ref 0.0–4.4)
Cholesterol, Total: 196 mg/dL (ref 100–199)
HDL: 39 mg/dL — ABNORMAL LOW (ref >39)
LDL Chol Calc (NIH): 115 mg/dL — ABNORMAL HIGH (ref 0–99)
Triglycerides: 243 mg/dL — ABNORMAL HIGH (ref 0–149)
VLDL Cholesterol Cal: 42 mg/dL — ABNORMAL HIGH (ref 5–40)

## 2022-11-17 LAB — B12 AND FOLATE PANEL
Folate: 6 ng/mL (ref >3.0)
Vitamin B-12: 804 pg/mL (ref 232–1245)

## 2022-12-05 ENCOUNTER — Ambulatory Visit (HOSPITAL_COMMUNITY)
Admission: RE | Admit: 2022-12-05 | Discharge: 2022-12-05 | Disposition: A | Discharge status: Home / Self Care | Payer: BC Managed Care – PPO | Source: Ambulatory Visit | Attending: Internal Medicine | Admitting: Internal Medicine

## 2022-12-05 DIAGNOSIS — Z1231 Encounter for screening mammogram for malignant neoplasm of breast: Secondary | ICD-10-CM | POA: Insufficient documentation

## 2022-12-19 ENCOUNTER — Telehealth: Payer: Self-pay

## 2022-12-19 NOTE — Telephone Encounter (Signed)
Do not see where anyone has tried to call patient

## 2022-12-19 NOTE — Telephone Encounter (Signed)
Copied from CRM [id number]. Topic: General - Call Back - No Documentation >> Dec 19, 2022  1:47 PM Prudencio Pair wrote: Reason for CRM: Patient called in stating that she received a call & missed it. Checked chart and did not see any documentation in regards to call. Please give patient a call back. CB #: [phone].

## 2022-12-28 DIAGNOSIS — Z1211 Encounter for screening for malignant neoplasm of colon: Secondary | ICD-10-CM | POA: Diagnosis not present

## 2023-01-06 LAB — COLOGUARD: COLOGUARD: POSITIVE — AB

## 2023-01-07 ENCOUNTER — Other Ambulatory Visit: Payer: Self-pay | Admitting: Internal Medicine

## 2023-01-07 DIAGNOSIS — R195 Other fecal abnormalities: Secondary | ICD-10-CM

## 2023-01-15 ENCOUNTER — Encounter: Payer: Self-pay | Admitting: Internal Medicine

## 2023-01-15 NOTE — H&P (View-Only) (Signed)
GI Office Note    Referring Provider: Billie Lade, MD Primary Care Physician:  Billie Lade, MD  Primary Gastroenterologist: Roetta Sessions, MD   Chief Complaint   Chief Complaint  Patient presents with   Colonoscopy    Positive cologuard test     History of Present Illness   Breanna Campbell is a 58 y.o. female presenting today at the request of Dr. Durwin Nora for positive Cologuard.  Additional labs on November 16, 2022: Hemoglobin 11.6 normal, platelets 289,000, white blood cell count 7200, creatinine 0.74, LFTs normal, A1c 5.8, TSH 2.820, B12 804, folate 6, vitamin D 31.5.  Last cologuard in 2021 was negative. No prior colonoscopy. No FH of CRC. She has chronic intermittent constipation. Does not use anything routinely to help her with BMs. Can have 2-3 BMs per week. No melena, brbpr. No abdominal pain. No UGI symptoms. No unintentional weight loss.   Medications   Current Outpatient Medications  Medication Sig Dispense Refill   cholecalciferol (VITAMIN D) 1000 UNITS tablet Take 1,000 Units by mouth daily.     Multiple Vitamin (MULTIVITAMIN WITH MINERALS) TABS tablet Take 1 tablet by mouth daily.     Plecanatide (TRULANCE) 3 MG TABS Take 1 tablet (3 mg total) by mouth daily. 30 tablet 5   No current facility-administered medications for this visit.    Allergies   Allergies as of 01/16/2023   (No Known Allergies)    Past Medical History   Past Medical History:  Diagnosis Date   Acquired trigger finger of right ring finger 10/09/2019   Anemia    Carpal tunnel syndrome 07/08/2018   Fibroid uterus 05/08/2011   12- 14 weeks size, removed via abdominal supracervical hysterectomy    Past Surgical History   Past Surgical History:  Procedure Laterality Date   CHOLECYSTECTOMY N/A 10/26/2013   Procedure: LAPAROSCOPIC CHOLECYSTECTOMY;  Surgeon: Dalia Heading, MD;  Location: [facility];  Service: General;  Laterality: N/A;   LAPAROSCOPIC SUPRACERVICAL  HYSTERECTOMY  2013   TUBAL LIGATION      Past Family History   Family History  Problem Relation Age of Onset   Diverticulitis Mother    Hypertension Mother    Hypothyroidism Mother    Hypertension Father    Hyperlipidemia Father    Anesthesia problems Neg Hx    Hypotension Neg Hx    Malignant hyperthermia Neg Hx    Pseudochol deficiency Neg Hx    Colon cancer Neg Hx     Past Social History   Social History   Socioeconomic History   Marital status: Married    Spouse name: Not on file   Number of children: Not on file   Years of education: Not on file   Highest education level: Not on file  Occupational History   Not on file  Tobacco Use   Smoking status: Never   Smokeless tobacco: Never  Substance and Sexual Activity   Alcohol use: No   Drug use: No   Sexual activity: Yes    Birth control/protection: Surgical  Other Topics Concern   Not on file  Social History Narrative   Not on file   Social Drivers of Health   Financial Resource Strain: Not on file  Food Insecurity: Not on file  Transportation Needs: Not on file  Physical Activity: Not on file  Stress: Not on file  Social Connections: Not on file  Intimate Partner Violence: Not on file    Review of Systems  General: Negative for anorexia, weight loss, fever, chills, fatigue, weakness. Eyes: Negative for vision changes.  ENT: Negative for hoarseness, difficulty swallowing , nasal congestion. CV: Negative for chest pain, angina, palpitations, dyspnea on exertion, peripheral edema.  Respiratory: Negative for dyspnea at rest, dyspnea on exertion, cough, sputum, wheezing.  GI: See history of present illness. GU:  Negative for dysuria, hematuria, urinary incontinence, urinary frequency, nocturnal urination.  MS: Negative for joint pain, low back pain.  Derm: Negative for rash or itching.  Neuro: Negative for weakness, abnormal sensation, seizure, frequent headaches, memory loss,  confusion.  Psych:  Negative for anxiety, depression, suicidal ideation, hallucinations.  Endo: Negative for unusual weight change.  Heme: Negative for bruising or bleeding. Allergy: Negative for rash or hives.  Physical Exam   BP (!) 158/78 (BP Location: Right Leg, Cuff Size: Normal)   Pulse 76   Temp 97.7 F (36.5 C) (Oral)   Ht 5\' 4"  (1.626 m)   Wt 188 lb 3.2 oz (85.4 kg)   LMP 04/10/2011   SpO2 98%   BMI 32.30 kg/m    General: Well-nourished, well-developed in no acute distress.  Head: Normocephalic, atraumatic.   Eyes: Conjunctiva pink, no icterus. Mouth: Oropharyngeal mucosa moist and pink  Neck: Supple without thyromegaly, masses, or lymphadenopathy.  Lungs: Clear to auscultation bilaterally.  Heart: Regular rate and rhythm, no murmurs rubs or gallops.  Abdomen: Bowel sounds are normal, nontender, nondistended, no hepatosplenomegaly or masses,  no abdominal bruits or hernia, no rebound or guarding.   Rectal: not performed Extremities: No lower extremity edema. No clubbing or deformities.  Neuro: Alert and oriented x 4 , grossly normal neurologically.  Skin: Warm and dry, no rash or jaundice.   Psych: Alert and cooperative, normal mood and affect.  Labs   Lab Results  Component Value Date   NA 139 11/16/2022   CL 102 11/16/2022   K 3.6 11/16/2022   CO2 20 11/16/2022   BUN 12 11/16/2022   CREATININE 0.74 11/16/2022   EGFR 95 11/16/2022   CALCIUM 9.5 11/16/2022   ALBUMIN 4.2 11/16/2022   GLUCOSE 96 11/16/2022   Lab Results  Component Value Date   ALT 21 11/16/2022   AST 23 11/16/2022   ALKPHOS 108 11/16/2022   BILITOT 0.2 11/16/2022   Lab Results  Component Value Date   WBC 7.2 11/16/2022   HGB 11.6 11/16/2022   HCT 35.9 11/16/2022   MCV 90 11/16/2022   PLT 289 11/16/2022   Lab Results  Component Value Date   TSH 2.820 11/16/2022   Lab Results  Component Value Date   HGBA1C 5.8 (H) 11/16/2022   Lab Results  Component Value Date   VITAMINB12 804 11/16/2022    Lab Results  Component Value Date   FOLATE 6.0 11/16/2022    Imaging Studies   No results found.  Assessment/plan:   Positive cologuard: -colonoscopy. ASA 2.  I have discussed the risks, alternatives, benefits with regards to but not limited to the risk of reaction to medication, bleeding, infection, perforation and the patient is agreeable to proceed. Written consent to be obtained.  Chronic intermittent constipation: -use Linzess daily week before colonoscopy to augment bowel prep, we had samples. -insurance covers Trulance but we have no samples and due to not knowing her out of pocket, we will plan for Linzess before bowel prep. Otherwise she can use Trulance 3mg  daily as needed at other times.    Leanna Battles. Melvyn Neth, MHS, PA-C [facility]

## 2023-01-15 NOTE — Progress Notes (Signed)
 GI Office Note    Referring Provider: Billie Lade, MD Primary Care Physician:  Billie Lade, MD  Primary Gastroenterologist: Roetta Sessions, MD   Chief Complaint   Chief Complaint  Patient presents with   Colonoscopy    Positive cologuard test     History of Present Illness   Breanna Campbell is a 58 y.o. female presenting today at the request of Dr. Durwin Nora for positive Cologuard.  Additional labs on November 16, 2022: Hemoglobin 11.6 normal, platelets 289,000, white blood cell count 7200, creatinine 0.74, LFTs normal, A1c 5.8, TSH 2.820, B12 804, folate 6, vitamin D 31.5.  Last cologuard in 2021 was negative. No prior colonoscopy. No FH of CRC. She has chronic intermittent constipation. Does not use anything routinely to help her with BMs. Can have 2-3 BMs per week. No melena, brbpr. No abdominal pain. No UGI symptoms. No unintentional weight loss.   Medications   Current Outpatient Medications  Medication Sig Dispense Refill   cholecalciferol (VITAMIN D) 1000 UNITS tablet Take 1,000 Units by mouth daily.     Multiple Vitamin (MULTIVITAMIN WITH MINERALS) TABS tablet Take 1 tablet by mouth daily.     Plecanatide (TRULANCE) 3 MG TABS Take 1 tablet (3 mg total) by mouth daily. 30 tablet 5   No current facility-administered medications for this visit.    Allergies   Allergies as of 01/16/2023   (No Known Allergies)    Past Medical History   Past Medical History:  Diagnosis Date   Acquired trigger finger of right ring finger 10/09/2019   Anemia    Carpal tunnel syndrome 07/08/2018   Fibroid uterus 05/08/2011   12- 14 weeks size, removed via abdominal supracervical hysterectomy    Past Surgical History   Past Surgical History:  Procedure Laterality Date   CHOLECYSTECTOMY N/A 10/26/2013   Procedure: LAPAROSCOPIC CHOLECYSTECTOMY;  Surgeon: Dalia Heading, MD;  Location: [facility];  Service: General;  Laterality: N/A;   LAPAROSCOPIC SUPRACERVICAL  HYSTERECTOMY  2013   TUBAL LIGATION      Past Family History   Family History  Problem Relation Age of Onset   Diverticulitis Mother    Hypertension Mother    Hypothyroidism Mother    Hypertension Father    Hyperlipidemia Father    Anesthesia problems Neg Hx    Hypotension Neg Hx    Malignant hyperthermia Neg Hx    Pseudochol deficiency Neg Hx    Colon cancer Neg Hx     Past Social History   Social History   Socioeconomic History   Marital status: Married    Spouse name: Not on file   Number of children: Not on file   Years of education: Not on file   Highest education level: Not on file  Occupational History   Not on file  Tobacco Use   Smoking status: Never   Smokeless tobacco: Never  Substance and Sexual Activity   Alcohol use: No   Drug use: No   Sexual activity: Yes    Birth control/protection: Surgical  Other Topics Concern   Not on file  Social History Narrative   Not on file   Social Drivers of Health   Financial Resource Strain: Not on file  Food Insecurity: Not on file  Transportation Needs: Not on file  Physical Activity: Not on file  Stress: Not on file  Social Connections: Not on file  Intimate Partner Violence: Not on file    Review of Systems  General: Negative for anorexia, weight loss, fever, chills, fatigue, weakness. Eyes: Negative for vision changes.  ENT: Negative for hoarseness, difficulty swallowing , nasal congestion. CV: Negative for chest pain, angina, palpitations, dyspnea on exertion, peripheral edema.  Respiratory: Negative for dyspnea at rest, dyspnea on exertion, cough, sputum, wheezing.  GI: See history of present illness. GU:  Negative for dysuria, hematuria, urinary incontinence, urinary frequency, nocturnal urination.  MS: Negative for joint pain, low back pain.  Derm: Negative for rash or itching.  Neuro: Negative for weakness, abnormal sensation, seizure, frequent headaches, memory loss,  confusion.  Psych:  Negative for anxiety, depression, suicidal ideation, hallucinations.  Endo: Negative for unusual weight change.  Heme: Negative for bruising or bleeding. Allergy: Negative for rash or hives.  Physical Exam   BP (!) 158/78 (BP Location: Right Leg, Cuff Size: Normal)   Pulse 76   Temp 97.7 F (36.5 C) (Oral)   Ht 5\' 4"  (1.626 m)   Wt 188 lb 3.2 oz (85.4 kg)   LMP 04/10/2011   SpO2 98%   BMI 32.30 kg/m    General: Well-nourished, well-developed in no acute distress.  Head: Normocephalic, atraumatic.   Eyes: Conjunctiva pink, no icterus. Mouth: Oropharyngeal mucosa moist and pink  Neck: Supple without thyromegaly, masses, or lymphadenopathy.  Lungs: Clear to auscultation bilaterally.  Heart: Regular rate and rhythm, no murmurs rubs or gallops.  Abdomen: Bowel sounds are normal, nontender, nondistended, no hepatosplenomegaly or masses,  no abdominal bruits or hernia, no rebound or guarding.   Rectal: not performed Extremities: No lower extremity edema. No clubbing or deformities.  Neuro: Alert and oriented x 4 , grossly normal neurologically.  Skin: Warm and dry, no rash or jaundice.   Psych: Alert and cooperative, normal mood and affect.  Labs   Lab Results  Component Value Date   NA 139 11/16/2022   CL 102 11/16/2022   K 3.6 11/16/2022   CO2 20 11/16/2022   BUN 12 11/16/2022   CREATININE 0.74 11/16/2022   EGFR 95 11/16/2022   CALCIUM 9.5 11/16/2022   ALBUMIN 4.2 11/16/2022   GLUCOSE 96 11/16/2022   Lab Results  Component Value Date   ALT 21 11/16/2022   AST 23 11/16/2022   ALKPHOS 108 11/16/2022   BILITOT 0.2 11/16/2022   Lab Results  Component Value Date   WBC 7.2 11/16/2022   HGB 11.6 11/16/2022   HCT 35.9 11/16/2022   MCV 90 11/16/2022   PLT 289 11/16/2022   Lab Results  Component Value Date   TSH 2.820 11/16/2022   Lab Results  Component Value Date   HGBA1C 5.8 (H) 11/16/2022   Lab Results  Component Value Date   VITAMINB12 804 11/16/2022    Lab Results  Component Value Date   FOLATE 6.0 11/16/2022    Imaging Studies   No results found.  Assessment/plan:   Positive cologuard: -colonoscopy. ASA 2.  I have discussed the risks, alternatives, benefits with regards to but not limited to the risk of reaction to medication, bleeding, infection, perforation and the patient is agreeable to proceed. Written consent to be obtained.  Chronic intermittent constipation: -use Linzess daily week before colonoscopy to augment bowel prep, we had samples. -insurance covers Trulance but we have no samples and due to not knowing her out of pocket, we will plan for Linzess before bowel prep. Otherwise she can use Trulance 3mg  daily as needed at other times.    Leanna Battles. Melvyn Neth, MHS, PA-C [facility]

## 2023-01-16 ENCOUNTER — Ambulatory Visit (INDEPENDENT_AMBULATORY_CARE_PROVIDER_SITE_OTHER): Payer: BC Managed Care – PPO | Admitting: Gastroenterology

## 2023-01-16 ENCOUNTER — Encounter: Payer: Self-pay | Admitting: *Deleted

## 2023-01-16 ENCOUNTER — Other Ambulatory Visit: Payer: Self-pay | Admitting: *Deleted

## 2023-01-16 ENCOUNTER — Encounter: Payer: Self-pay | Admitting: Gastroenterology

## 2023-01-16 VITALS — BP 158/78 | HR 76 | Temp 97.7°F | Ht 64.0 in | Wt 188.2 lb

## 2023-01-16 DIAGNOSIS — K5900 Constipation, unspecified: Secondary | ICD-10-CM

## 2023-01-16 DIAGNOSIS — R195 Other fecal abnormalities: Secondary | ICD-10-CM | POA: Insufficient documentation

## 2023-01-16 DIAGNOSIS — K5909 Other constipation: Secondary | ICD-10-CM

## 2023-01-16 MED ORDER — TRULANCE 3 MG PO TABS: 3.0000 mg | ORAL_TABLET | Freq: Every day | ORAL | 5 refills | Status: DC

## 2023-01-16 MED ORDER — PEG 3350-KCL-NA BICARB-NACL 420 G PO SOLR: 4000.0000 mL | Freq: Once | ORAL | 0 refills | Status: AC

## 2023-01-16 NOTE — Patient Instructions (Signed)
 I have sent in RX for Trulance  for constipation. You can take 3mg  daily as needed. Unfortunately we do not have samples. I looks like it should be on your formulary but I am not sure how affordable it will be therefore I have given you samples of Linzess for use prior to your colonoscopy as discussed.  YOU WILL USE LINZESS STARTING 7 DAYS BEFORE YOUR COLONOSCOPY (one daily before first meal of the day), YOU WILL USE THE SAMPLES PROVIDED.  DO NOT TAKE BOTH TRULANCE  AND LINZESS ON THE SAME DAYS, USE ONE OR THE OTHER. Colonoscopy to be scheduled.

## 2023-01-24 ENCOUNTER — Encounter (HOSPITAL_COMMUNITY)
Admission: RE | Disposition: A | Discharge status: Home / Self Care | Payer: Self-pay | Source: Home / Self Care | Attending: Internal Medicine

## 2023-01-24 ENCOUNTER — Ambulatory Visit (HOSPITAL_COMMUNITY): Payer: BC Managed Care – PPO | Admitting: Anesthesiology

## 2023-01-24 ENCOUNTER — Other Ambulatory Visit: Payer: Self-pay

## 2023-01-24 ENCOUNTER — Encounter (HOSPITAL_COMMUNITY): Payer: Self-pay | Admitting: Internal Medicine

## 2023-01-24 ENCOUNTER — Ambulatory Visit (HOSPITAL_COMMUNITY)
Admission: RE | Admit: 2023-01-24 | Discharge: 2023-01-24 | Disposition: A | Discharge status: Home / Self Care | Payer: BC Managed Care – PPO | Attending: Internal Medicine | Admitting: Internal Medicine

## 2023-01-24 DIAGNOSIS — K5909 Other constipation: Secondary | ICD-10-CM | POA: Diagnosis not present

## 2023-01-24 DIAGNOSIS — D125 Benign neoplasm of sigmoid colon: Secondary | ICD-10-CM

## 2023-01-24 DIAGNOSIS — R195 Other fecal abnormalities: Secondary | ICD-10-CM | POA: Diagnosis not present

## 2023-01-24 DIAGNOSIS — Z1211 Encounter for screening for malignant neoplasm of colon: Secondary | ICD-10-CM | POA: Diagnosis not present

## 2023-01-24 DIAGNOSIS — F419 Anxiety disorder, unspecified: Secondary | ICD-10-CM | POA: Diagnosis not present

## 2023-01-24 DIAGNOSIS — K635 Polyp of colon: Secondary | ICD-10-CM | POA: Diagnosis not present

## 2023-01-24 HISTORY — PX: POLYPECTOMY: SHX5525

## 2023-01-24 HISTORY — PX: COLONOSCOPY WITH PROPOFOL: SHX5780

## 2023-01-24 SURGERY — COLONOSCOPY WITH PROPOFOL
Anesthesia: General

## 2023-01-24 MED ORDER — SODIUM CHLORIDE 0.9% FLUSH: 3.0000 mL | Freq: Two times a day (BID) | INTRAVENOUS | Status: DC

## 2023-01-24 MED ORDER — LACTATED RINGERS IV SOLN: INTRAVENOUS | Status: DC | PRN

## 2023-01-24 MED ORDER — PROPOFOL 10 MG/ML IV BOLUS
INTRAVENOUS | Status: DC | PRN
  Administered 2023-01-24: 100 mg via INTRAVENOUS

## 2023-01-24 MED ORDER — PROPOFOL 500 MG/50ML IV EMUL
INTRAVENOUS | Status: DC | PRN
  Administered 2023-01-24: 150 ug/kg/min via INTRAVENOUS

## 2023-01-24 MED ORDER — SODIUM CHLORIDE 0.9% FLUSH: 3.0000 mL | INTRAVENOUS | Status: DC | PRN

## 2023-01-24 MED ORDER — STERILE WATER FOR IRRIGATION IR SOLN
Status: DC | PRN
  Administered 2023-01-24: 100 mL

## 2023-01-24 MED ORDER — LIDOCAINE HCL (PF) 2 % IJ SOLN
INTRAMUSCULAR | Status: DC | PRN
  Administered 2023-01-24: 50 mg via INTRADERMAL

## 2023-01-24 NOTE — Op Note (Signed)
[facility] Patient Name: Breanna Campbell Procedure Date: 01/24/2023 10:31 AM MRN: [id number] Date of Birth: [birth year] Attending MD: Gennette Pac , MD, [id number] CSN: [id number] Age: 58 Admit Type: Outpatient Procedure:                Colonoscopy Indications:              Positive Cologuard test Providers:                Gennette Pac, MD, Buel Ream. Thomasena Edis RN, RN,                            Zena Amos Referring MD:              Medicines:                Propofol per Anesthesia Complications:            No immediate complications. Estimated Blood Loss:     Estimated blood loss: none. Procedure:                Pre-Anesthesia Assessment:                           - Prior to the procedure, a History and Physical                            was performed, and patient medications and                            allergies were reviewed. The patient's tolerance of                            previous anesthesia was also reviewed. The risks                            and benefits of the procedure and the sedation                            options and risks were discussed with the patient.                            All questions were answered, and informed consent                            was obtained. Prior Anticoagulants: The patient has                            taken no anticoagulant or antiplatelet agents. ASA                            Grade Assessment: II - A patient with mild systemic                            disease. After reviewing the risks and benefits,  the patient was deemed in satisfactory condition to                            undergo the procedure.                           After obtaining informed consent, the colonoscope                            was passed under direct vision. Throughout the                            procedure, the patient's blood pressure, pulse, and                            oxygen saturations  were monitored continuously. The                            [id number]) scope was introduced through the                            anus and advanced to the the cecum, identified by                            appendiceal orifice and ileocecal valve. The                            colonoscopy was performed without difficulty. The                            patient tolerated the procedure well. The quality                            of the bowel preparation was adequate. The                            ileocecal valve, appendiceal orifice, and rectum                            were photographed. Scope In: 10:47:22 AM Scope Out: 11:03:42 AM Scope Withdrawal Time: 0 hours 9 minutes 3 seconds  Total Procedure Duration: 0 hours 16 minutes 20 seconds  Findings:      The perianal and digital rectal examinations were normal.      An 8 mm polyp was found in the sigmoid colon. The polyp was       pedunculated. The polyp was removed with a hot snare. Resection and       retrieval were complete. Estimated blood loss: none.      The exam was otherwise without abnormality on direct and retroflexion       views. Impression:               - One 8 mm polyp in the sigmoid colon, removed with  a hot snare. Resected and retrieved.                           - The examination was otherwise normal on direct                            and retroflexion views. Moderate Sedation:      Moderate (conscious) sedation was personally administered by an       anesthesia professional. The following parameters were monitored: oxygen       saturation, heart rate, blood pressure, respiratory rate, EKG, adequacy       of pulmonary ventilation, and response to care.      Moderate (conscious) sedation was personally administered by an       anesthesia professional. The following parameters were monitored: oxygen       saturation, heart rate, blood pressure, respiratory rate, EKG, adequacy        of pulmonary ventilation, and response to care. Recommendation:           - Patient has a contact number available for                            emergencies. The signs and symptoms of potential                            delayed complications were discussed with the                            patient. Return to normal activities tomorrow.                            Written discharge instructions were provided to the                            patient.                           - Advance diet as tolerated.                           - Continue present medications.                           - Repeat colonoscopy date to be determined after                            pending pathology results are reviewed for                            surveillance.                           - Return to GI office (date not yet determined). Procedure Code(s):        --- Professional ---                           [phone], Colonoscopy, flexible; with removal of  tumor(s), polyp(s), or other lesion(s) by snare                            technique Diagnosis Code(s):        --- Professional ---                           D12.5, Benign neoplasm of sigmoid colon                           R19.5, Other fecal abnormalities CPT copyright 2022 [employer]. All rights reserved. The codes documented in this report are preliminary and upon coder review may  be revised to meet current compliance requirements. Gerrit Friends. Bassheva Flury, MD Gennette Pac, MD 01/24/2023 11:11:15 AM This report has been signed electronically. Number of Addenda: 0

## 2023-01-24 NOTE — Anesthesia Procedure Notes (Signed)
Date/Time: 01/24/2023 10:41 AM  Performed by: Julian Reil, CRNAPre-anesthesia Checklist: Patient identified, Emergency Drugs available, Suction available and Patient being monitored Patient Re-evaluated:Patient Re-evaluated prior to induction Oxygen Delivery Method: Nasal cannula Induction Type: IV induction Placement Confirmation: positive ETCO2

## 2023-01-24 NOTE — Interval H&P Note (Signed)
History and Physical Interval Note:  01/24/2023 10:35 AM  Breanna Campbell  has presented today for surgery, with the diagnosis of positive cologuard.  The various methods of treatment have been discussed with the patient and family. After consideration of risks, benefits and other options for treatment, the patient has consented to  Procedure(s) with comments: COLONOSCOPY WITH PROPOFOL (N/A) - 1:30 am, asa 2, pt knows to arrive at 9:15 as a surgical intervention.  The patient's history has been reviewed, patient examined, no change in status, stable for surgery.  I have reviewed the patient's chart and labs.  Questions were answered to the patient's satisfaction.     Khaliel Morey   no prior colonoscopy.  No change.  Positive Cologuard.  Diagnostic colonoscopy today per plan.  The risks, benefits, limitations, alternatives and imponderables have been reviewed with the patient. Questions have been answered. All parties are agreeable.

## 2023-01-24 NOTE — Anesthesia Preprocedure Evaluation (Signed)
Anesthesia Evaluation  Patient identified by MRN, date of birth, ID band Patient awake    Reviewed: Allergy & Precautions, H&P , NPO status , Patient's Chart, lab work & pertinent test results  Airway Mallampati: I  TM Distance: >3 FB Neck ROM: Full    Dental  (+) Poor Dentition, Chipped,    Pulmonary neg pulmonary ROS   breath sounds clear to auscultation       Cardiovascular negative cardio ROS  Rhythm:Regular Rate:Normal     Neuro/Psych  PSYCHIATRIC DISORDERS Anxiety        GI/Hepatic negative GI ROS,,,  Endo/Other    Renal/GU      Musculoskeletal   Abdominal   Peds  Hematology  (+) Blood dyscrasia, anemia   Anesthesia Other Findings   Reproductive/Obstetrics                             Anesthesia Physical Anesthesia Plan  ASA: 2  Anesthesia Plan: General   Post-op Pain Management: Minimal or no pain anticipated   Induction: Intravenous  PONV Risk Score and Plan: Propofol infusion  Airway Management Planned: Nasal Cannula and Natural Airway  Additional Equipment: None  Intra-op Plan:   Post-operative Plan:   Informed Consent: I have reviewed the patients History and Physical, chart, labs and discussed the procedure including the risks, benefits and alternatives for the proposed anesthesia with the patient or authorized representative who has indicated his/her understanding and acceptance.     Dental advisory given  Plan Discussed with: CRNA  Anesthesia Plan Comments:         Anesthesia Quick Evaluation

## 2023-01-24 NOTE — Discharge Instructions (Addendum)
  Colonoscopy Discharge Instructions  Read the instructions outlined below and refer to this sheet in the next few weeks. These discharge instructions provide you with general information on caring for yourself after you leave the hospital. Your doctor may also give you specific instructions. While your treatment has been planned according to the most current medical practices available, unavoidable complications occasionally occur. If you have any problems or questions after discharge, call Dr. Jena Gauss at [phone]. ACTIVITY You may resume your regular activity, but move at a slower pace for the next 24 hours.  Take frequent rest periods for the next 24 hours.  Walking will help get rid of the air and reduce the bloated feeling in your belly (abdomen).  No driving for 24 hours (because of the medicine (anesthesia) used during the test).   Do not sign any important legal documents or operate any machinery for 24 hours (because of the anesthesia used during the test).  NUTRITION Drink plenty of fluids.  You may resume your normal diet as instructed by your doctor.  Begin with a light meal and progress to your normal diet. Heavy or fried foods are harder to digest and may make you feel sick to your stomach (nauseated).  Avoid alcoholic beverages for 24 hours or as instructed.  MEDICATIONS You may resume your normal medications unless your doctor tells you otherwise.  WHAT YOU CAN EXPECT TODAY Some feelings of bloating in the abdomen.  Passage of more gas than usual.  Spotting of blood in your stool or on the toilet paper.  IF YOU HAD POLYPS REMOVED DURING THE COLONOSCOPY: No aspirin products for 7 days or as instructed.  No alcohol for 7 days or as instructed.  Eat a soft diet for the next 24 hours.  FINDING OUT THE RESULTS OF YOUR TEST Not all test results are available during your visit. If your test results are not back during the visit, make an appointment with your caregiver to find out the  results. Do not assume everything is normal if you have not heard from your caregiver or the medical facility. It is important for you to follow up on all of your test results.  SEEK IMMEDIATE MEDICAL ATTENTION IF: You have more than a spotting of blood in your stool.  Your belly is swollen (abdominal distention).  You are nauseated or vomiting.  You have a temperature over 101.  You have abdominal pain or discomfort that is severe or gets worse throughout the day.     1 polyp found and removed from your colon today  Further recommendations to follow pending review of pathology report   at patient request, I called Merve Pellicer at [phone] findings and recommendations

## 2023-01-24 NOTE — Anesthesia Postprocedure Evaluation (Signed)
Anesthesia Post Note  Patient: TRENECIA GAFFKE  Procedure(s) Performed: COLONOSCOPY WITH PROPOFOL POLYPECTOMY  Patient location during evaluation: PACU Anesthesia Type: General Level of consciousness: awake and alert Pain management: pain level controlled Vital Signs Assessment: post-procedure vital signs reviewed and stable Respiratory status: spontaneous breathing, nonlabored ventilation, respiratory function stable and patient connected to nasal cannula oxygen Cardiovascular status: blood pressure returned to baseline and stable Postop Assessment: no apparent nausea or vomiting Anesthetic complications: no   There were no known notable events for this encounter.   Last Vitals:  Vitals:   01/24/23 1106 01/24/23 1109  BP: (!) 108/51   Pulse: 78 78  Resp: (!) 21 (!) 21  Temp: 36.9 C   SpO2: 93% 95%    Last Pain:  Vitals:   01/24/23 1109  TempSrc:   PainSc: 0-No pain                 Verdine Grenfell L Liyla Radliff

## 2023-01-24 NOTE — Transfer of Care (Signed)
Immediate Anesthesia Transfer of Care Note  Patient: Breanna Campbell  Procedure(s) Performed: COLONOSCOPY WITH PROPOFOL POLYPECTOMY  Patient Location: Endoscopy Unit  Anesthesia Type:General  Level of Consciousness: drowsy  Airway & Oxygen Therapy: Patient Spontanous Breathing  Post-op Assessment: Report given to RN and Post -op Vital signs reviewed and stable  Post vital signs: Reviewed and stable  Last Vitals:  Vitals Value Taken Time  BP    Temp    Pulse    Resp    SpO2      Last Pain:  Vitals:   01/24/23 1046  TempSrc:   PainSc: 0-No pain      Patients Stated Pain Goal: 5 (01/24/23 1000)  Complications: No notable events documented.

## 2023-01-25 ENCOUNTER — Encounter (HOSPITAL_COMMUNITY): Payer: Self-pay | Admitting: Internal Medicine

## 2023-01-25 LAB — SURGICAL PATHOLOGY

## 2023-01-31 ENCOUNTER — Encounter: Payer: Self-pay | Admitting: Internal Medicine

## 2023-05-17 ENCOUNTER — Ambulatory Visit: Payer: BC Managed Care – PPO | Admitting: Internal Medicine

## 2023-05-17 ENCOUNTER — Encounter: Payer: Self-pay | Admitting: Internal Medicine

## 2023-05-17 VITALS — BP 126/70 | HR 82 | Ht 64.0 in | Wt 192.4 lb

## 2023-05-17 DIAGNOSIS — R7303 Prediabetes: Secondary | ICD-10-CM | POA: Insufficient documentation

## 2023-05-17 DIAGNOSIS — E782 Mixed hyperlipidemia: Secondary | ICD-10-CM

## 2023-05-17 MED ORDER — ROSUVASTATIN CALCIUM 5 MG PO TABS: 5.0000 mg | ORAL_TABLET | Freq: Every day | ORAL | 3 refills | Status: AC

## 2023-05-17 NOTE — Patient Instructions (Signed)
 It was a pleasure to see you today.  Thank you for giving us  the opportunity to be involved in your care.  Below is a brief recap of your visit and next steps.  We will plan to see you again in 6 months.  Summary Resume rosuvastatin  5 mg daily Follow up in 6 months

## 2023-05-17 NOTE — Progress Notes (Signed)
 Established Patient Office Visit  Subjective   Patient ID: Breanna Campbell, female    DOB: [birth year]  Age: 58 y.o. MRN: [id number]  Chief Complaint  Patient presents with   Care Management    Follow up    Breanna Campbell returns to care today for follow-up.  She was last evaluated by me in November 2024 as a new patient presenting to reestablish care.  No medication changes were made at that time, repeat labs ordered, and 26-month follow-up arranged.  In the interim, she was referred to gastroenterology in the setting of a positive Cologuard test.  She underwent colonoscopy on 1/16, which showed an 8 mm polyp in the sigmoid colon.  Pathology showed a tubular adenoma negative for high-grade dysplasia or malignancy.  There have otherwise been no acute interval events.  Breanna Campbell reports feeling well today.  She is asymptomatic and has no acute concerns to discuss.  Past Medical History:  Diagnosis Date   Acquired trigger finger of right ring finger 10/09/2019   Anemia    Carpal tunnel syndrome 07/08/2018   Fibroid uterus 05/08/2011   12- 14 weeks size, removed via abdominal supracervical hysterectomy   Past Surgical History:  Procedure Laterality Date   CHOLECYSTECTOMY N/A 10/26/2013   Procedure: LAPAROSCOPIC CHOLECYSTECTOMY;  Surgeon: Beau Bound, MD;  Location: [facility];  Service: General;  Laterality: N/A;   COLONOSCOPY WITH PROPOFOL  N/A 01/24/2023   Procedure: COLONOSCOPY WITH PROPOFOL ;  Surgeon: Suzette Espy, MD;  Location: [facility];  Service: Endoscopy;  Laterality: N/A;  1:30 am, asa 2, pt knows to arrive at 9:15   LAPAROSCOPIC SUPRACERVICAL HYSTERECTOMY  2013   POLYPECTOMY  01/24/2023   Procedure: POLYPECTOMY;  Surgeon: Suzette Espy, MD;  Location: [facility];  Service: Endoscopy;;   TUBAL LIGATION     Social History   Tobacco Use   Smoking status: Never   Smokeless tobacco: Never  Substance Use Topics   Alcohol use: No   Drug use: No   Family History   Problem Relation Age of Onset   Diverticulitis Mother    Hypertension Mother    Hypothyroidism Mother    Hypertension Father    Hyperlipidemia Father    Anesthesia problems Neg Hx    Hypotension Neg Hx    Malignant hyperthermia Neg Hx    Pseudochol deficiency Neg Hx    Colon cancer Neg Hx    No Known Allergies  Review of Systems  Constitutional:  Negative for chills and fever.  HENT:  Negative for sore throat.   Respiratory:  Negative for cough and shortness of breath.   Cardiovascular:  Negative for chest pain, palpitations and leg swelling.  Gastrointestinal:  Negative for abdominal pain, blood in stool, constipation, diarrhea, nausea and vomiting.  Genitourinary:  Negative for dysuria and hematuria.  Musculoskeletal:  Negative for myalgias.  Skin:  Negative for itching and rash.  Neurological:  Negative for dizziness and headaches.  Psychiatric/Behavioral:  Negative for depression and suicidal ideas.      Objective:     BP 126/70   Pulse 82   Ht 5\' 4"  (1.626 m)   Wt 192 lb 6.4 oz (87.3 kg)   LMP 04/10/2011   SpO2 98%   BMI 33.03 kg/m  BP Readings from Last 3 Encounters:  05/17/23 126/70  01/24/23 (!) 108/51  01/16/23 (!) 158/78   Physical Exam Vitals reviewed.  Constitutional:      General: She is not in acute distress.  Appearance: Normal appearance. She is obese. She is not toxic-appearing.  HENT:     Head: Normocephalic and atraumatic.     Right Ear: External ear normal.     Left Ear: External ear normal.     Nose: Nose normal. No congestion or rhinorrhea.     Mouth/Throat:     Mouth: Mucous membranes are moist.     Pharynx: Oropharynx is clear. No oropharyngeal exudate or posterior oropharyngeal erythema.  Eyes:     General: No scleral icterus.    Extraocular Movements: Extraocular movements intact.     Conjunctiva/sclera: Conjunctivae normal.     Pupils: Pupils are equal, round, and reactive to light.  Cardiovascular:     Rate and Rhythm:  Normal rate and regular rhythm.     Pulses: Normal pulses.     Heart sounds: Normal heart sounds. No murmur heard.    No friction rub. No gallop.  Pulmonary:     Effort: Pulmonary effort is normal.     Breath sounds: Normal breath sounds. No wheezing, rhonchi or rales.  Abdominal:     General: Abdomen is flat. Bowel sounds are normal. There is no distension.     Palpations: Abdomen is soft.     Tenderness: There is no abdominal tenderness.  Musculoskeletal:        General: No swelling. Normal range of motion.     Cervical back: Normal range of motion.     Right lower leg: No edema.     Left lower leg: No edema.  Lymphadenopathy:     Cervical: No cervical adenopathy.  Skin:    General: Skin is warm and dry.     Capillary Refill: Capillary refill takes less than 2 seconds.     Coloration: Skin is not jaundiced.  Neurological:     General: No focal deficit present.     Mental Status: She is alert and oriented to person, place, and time.  Psychiatric:        Mood and Affect: Mood normal.        Behavior: Behavior normal.   Last CBC Lab Results  Component Value Date   WBC 7.2 11/16/2022   HGB 11.6 11/16/2022   HCT 35.9 11/16/2022   MCV 90 11/16/2022   MCH 29.1 11/16/2022   RDW 11.8 11/16/2022   PLT 289 11/16/2022   Last metabolic panel Lab Results  Component Value Date   GLUCOSE 96 11/16/2022   NA 139 11/16/2022   K 3.6 11/16/2022   CL 102 11/16/2022   CO2 20 11/16/2022   BUN 12 11/16/2022   CREATININE 0.74 11/16/2022   EGFR 95 11/16/2022   CALCIUM  9.5 11/16/2022   PROT 7.7 11/16/2022   ALBUMIN 4.2 11/16/2022   LABGLOB 3.5 11/16/2022   AGRATIO 1.3 03/01/2021   BILITOT 0.2 11/16/2022   ALKPHOS 108 11/16/2022   AST 23 11/16/2022   ALT 21 11/16/2022   ANIONGAP 11 10/22/2013   Last lipids Lab Results  Component Value Date   CHOL 196 11/16/2022   HDL 39 (L) 11/16/2022   LDLCALC 115 (H) 11/16/2022   TRIG 243 (H) 11/16/2022   CHOLHDL 5.0 (H) 11/16/2022    Last hemoglobin A1c Lab Results  Component Value Date   HGBA1C 5.8 (H) 11/16/2022   Last thyroid  functions Lab Results  Component Value Date   TSH 2.820 11/16/2022   Last vitamin D  Lab Results  Component Value Date   VD25OH 31.5 11/16/2022   Last vitamin B12 and Folate Lab  Results  Component Value Date   VITAMINB12 804 11/16/2022   FOLATE 6.0 11/16/2022   The 10-year ASCVD risk score (Arnett DK, et al., 2019) is: 4.2%    Assessment & Plan:   Problem List Items Addressed This Visit       Hyperlipidemia - Primary   Lipid panel updated in November.  Total cholesterol 196 and LDL 115.  She has previously been prescribed rosuvastatin  5 mg daily.  We discussed resuming statin therapy vs focusing on dietary changes in an effort to lower her cholesterol.  She prefers to resume rosuvastatin  5 mg daily.  New prescription sent today.  Repeat lipid panel at follow-up in 6 months.      Prediabetes   A1c 5.8 on labs from November.  We discussed the need to make significant dietary changes in an effort to lose weight and lower her blood sugar.       Return in about 6 months (around 11/17/2023) for CPE.    Tobi Fortes, MD

## 2023-05-17 NOTE — Assessment & Plan Note (Signed)
 Lipid panel updated in November.  Total cholesterol 196 and LDL 115.  She has previously been prescribed rosuvastatin  5 mg daily.  We discussed resuming statin therapy vs focusing on dietary changes in an effort to lower her cholesterol.  She prefers to resume rosuvastatin  5 mg daily.  New prescription sent today.  Repeat lipid panel at follow-up in 6 months.

## 2023-05-17 NOTE — Assessment & Plan Note (Signed)
 A1c 5.8 on labs from November.  We discussed the need to make significant dietary changes in an effort to lose weight and lower her blood sugar.

## 2023-11-22 ENCOUNTER — Ambulatory Visit

## 2023-11-22 VITALS — BP 166/100 | HR 74 | Ht 64.0 in | Wt 189.0 lb

## 2023-11-22 DIAGNOSIS — E66811 Obesity, class 1: Secondary | ICD-10-CM | POA: Diagnosis not present

## 2023-11-22 DIAGNOSIS — Z0000 Encounter for general adult medical examination without abnormal findings: Secondary | ICD-10-CM

## 2023-11-22 DIAGNOSIS — E782 Mixed hyperlipidemia: Secondary | ICD-10-CM | POA: Diagnosis not present

## 2023-11-22 DIAGNOSIS — Z131 Encounter for screening for diabetes mellitus: Secondary | ICD-10-CM

## 2023-11-22 DIAGNOSIS — E559 Vitamin D deficiency, unspecified: Secondary | ICD-10-CM

## 2023-11-22 DIAGNOSIS — Z0001 Encounter for general adult medical examination with abnormal findings: Secondary | ICD-10-CM

## 2023-11-22 DIAGNOSIS — R030 Elevated blood-pressure reading, without diagnosis of hypertension: Secondary | ICD-10-CM

## 2023-11-22 NOTE — Progress Notes (Signed)
 Complete physical exam  Patient: Breanna Campbell   DOB: 09-10-1965   59 y.o. Female  MRN: [id number]  Subjective:    Chief Complaint  Patient presents with   Annual Exam    Breanna Campbell is a 58 y.o. female who presents today for a complete physical exam. She reports consuming a general diet. The patient does not participate in regular exercise at present. She generally feels well. She reports sleeping well. She does not have additional problems to discuss today.    Most recent fall risk assessment:    05/17/2023    9:31 AM  Fall Risk   Falls in the past year? 0  Number falls in past yr: 0  Injury with Fall? 0  Risk for fall due to : No Fall Risks  Follow up Falls evaluation completed     Most recent depression screenings:    05/17/2023    9:31 AM 11/16/2022    1:29 PM  PHQ 2/9 Scores  PHQ - 2 Score 0 0  PHQ- 9 Score 0  4      Data saved with a previous flowsheet row definition      Patient Active Problem List   Diagnosis Date Noted   Elevated blood pressure reading in office without diagnosis of hypertension 11/26/2023   Prediabetes 05/17/2023   Positive colorectal cancer screening using Cologuard test 01/16/2023   Hyperlipidemia 11/16/2022   Obesity (BMI 30.0-34.9) 11/16/2022   Need for influenza vaccination 11/16/2022   Vasomotor symptoms due to menopause 11/16/2022   Breast cancer screening by mammogram 11/16/2022   Colon cancer screening 11/16/2022   Vitamin D  deficiency 01/25/2013   Constipation 10/24/2011      Patient Care Team: Bevely Doffing, FNP as PCP - General (Family Medicine)   Outpatient Medications Prior to Visit  Medication Sig   cholecalciferol (VITAMIN D ) 1000 UNITS tablet Take 1,000 Units by mouth daily.   Multiple Vitamin (MULTIVITAMIN WITH MINERALS) TABS tablet Take 1 tablet by mouth daily.   rosuvastatin  (CRESTOR ) 5 MG tablet Take 1 tablet (5 mg total) by mouth daily.   No facility-administered medications prior to visit.     ROS     Objective:     BP (!) 166/100   Pulse 74   Ht 5' 4 (1.626 m)   Wt 189 lb 0.6 oz (85.7 kg)   LMP 04/10/2011   SpO2 98%   BMI 32.45 kg/m  BP Readings from Last 3 Encounters:  11/22/23 (!) 166/100  05/17/23 126/70  01/24/23 (!) 108/51   Wt Readings from Last 3 Encounters:  11/22/23 189 lb 0.6 oz (85.7 kg)  05/17/23 192 lb 6.4 oz (87.3 kg)  01/24/23 185 lb (83.9 kg)      Physical Exam Vitals and nursing note reviewed.  Constitutional:      Appearance: Normal appearance.  HENT:     Head: Normocephalic.     Right Ear: Tympanic membrane, ear canal and external ear normal.     Left Ear: Tympanic membrane, ear canal and external ear normal.     Nose: Nose normal.     Mouth/Throat:     Mouth: Mucous membranes are moist.     Pharynx: Oropharynx is clear.  Eyes:     Extraocular Movements: Extraocular movements intact.     Pupils: Pupils are equal, round, and reactive to light.  Cardiovascular:     Rate and Rhythm: Normal rate and regular rhythm.  Pulmonary:     Effort: Pulmonary effort  is normal.     Breath sounds: Normal breath sounds.  Abdominal:     General: Abdomen is flat. Bowel sounds are normal.     Palpations: Abdomen is soft.  Musculoskeletal:        General: Normal range of motion.     Cervical back: Normal range of motion and neck supple.  Skin:    General: Skin is warm and dry.  Neurological:     Mental Status: She is alert and oriented to person, place, and time.  Psychiatric:        Mood and Affect: Mood normal.        Thought Content: Thought content normal.           Assessment & Plan:    Routine Health Maintenance and Physical Exam  Immunization History  Administered Date(s) Administered   Influenza Split 10/24/2011   Influenza, Seasonal, Injecte, Preservative Fre 11/16/2022   Influenza,inj,Quad PF,6+ Mos 12/23/2012, 12/02/2013, 12/20/2015, 04/06/2017, 12/17/2017, 09/16/2019, 10/07/2020   Moderna Sars-Covid-2 Vaccination  04/30/2019, 05/30/2019   Td 01/23/2013   Tdap 01/23/2013    Health Maintenance  Topic Date Due   Hepatitis B Vaccines 19-59 Average Risk (1 of 3 - 19+ 3-dose series) Never done   Pneumococcal Vaccine: 50+ Years (1 of 1 - PCV) Never done   Zoster Vaccines- Shingrix (1 of 2) Never done   DTaP/Tdap/Td (3 - Td or Tdap) 01/24/2023   COVID-19 Vaccine (3 - 2025-26 season) 09/09/2023   Influenza Vaccine  04/07/2024 (Originally 08/09/2023)   Mammogram  12/04/2024   Fecal DNA (Cologuard)  12/27/2025   Cervical Cancer Screening (HPV/Pap Cotest)  04/26/2026   Hepatitis C Screening  Completed   HIV Screening  Completed   HPV VACCINES  Aged Out   Meningococcal B Vaccine  Aged Out   Colonoscopy  Discontinued    Discussed health benefits of physical activity, and encouraged her to engage in regular exercise appropriate for her age and condition.  Problem List Items Addressed This Visit       Other   Vitamin D  deficiency   Previously documented history of vitamin D  deficiency.  Reports that she is on daily vitamin D  supplementation. -Repeat vitamin D  level ordered today      Relevant Orders   VITAMIN D  25 Hydroxy (Vit-D Deficiency, Fractures) (Completed)   Hyperlipidemia   Currently prescribed rosuvastatin  5 mg daily.  We discussed focusing on dietary changes in an effort to lower her cholesterol.  Repeat lipid panel today.       Relevant Orders   CMP14+EGFR (Completed)   Lipid panel (Completed)   TSH + free T4 (Completed)   Obesity (BMI 30.0-34.9)   BMI 32.45.  Modifications aimed at weight loss were reviewed today.  Screening labs ordered.      Elevated blood pressure reading in office without diagnosis of hypertension   EKG showed normal sinus rhythm, no signs of ischemia or abnormal rhythm.  Discussed need for low-sodium diet and regular exercise to help lower blood pressure.  Recommend returning to clinic for blood pressure check in 1 to 2 weeks.      Relevant Orders   EKG  12-Lead (Completed)   Other Visit Diagnoses       Encounter for preventative adult health care examination    -  Primary   Unremarkable exam in office today.  Will check fasting labs for further evaluation   Relevant Orders   CBC with Differential/Platelet (Completed)   CMP14+EGFR (Completed)   Lipid  panel (Completed)   Hemoglobin A1c (Completed)   TSH + free T4 (Completed)   VITAMIN D  25 Hydroxy (Vit-D Deficiency, Fractures) (Completed)   B12 and Folate Panel (Completed)   EKG 12-Lead (Completed)     Diabetes mellitus screening       Relevant Orders   CMP14+EGFR (Completed)   Hemoglobin A1c (Completed)   B12 and Folate Panel (Completed)      Return in about 6 weeks (around 01/03/2024) for for recheck of blood pressure.     Leita Longs, FNP

## 2023-11-23 LAB — CBC WITH DIFFERENTIAL/PLATELET
Basophils Absolute: 0.1 x10E3/uL (ref 0.0–0.2)
Basos: 1 %
EOS (ABSOLUTE): 0.3 x10E3/uL (ref 0.0–0.4)
Eos: 4 %
Hematocrit: 38.7 % (ref 34.0–46.6)
Hemoglobin: 12.6 g/dL (ref 11.1–15.9)
Immature Grans (Abs): 0 x10E3/uL (ref 0.0–0.1)
Immature Granulocytes: 0 %
Lymphocytes Absolute: 3 x10E3/uL (ref 0.7–3.1)
Lymphs: 44 %
MCH: 29.3 pg (ref 26.6–33.0)
MCHC: 32.6 g/dL (ref 31.5–35.7)
MCV: 90 fL (ref 79–97)
Monocytes Absolute: 0.5 x10E3/uL (ref 0.1–0.9)
Monocytes: 7 %
Neutrophils Absolute: 3.1 x10E3/uL (ref 1.4–7.0)
Neutrophils: 44 %
Platelets: 296 x10E3/uL (ref 150–450)
RBC: 4.3 x10E6/uL (ref 3.77–5.28)
RDW: 11.7 % (ref 11.7–15.4)
WBC: 6.9 x10E3/uL (ref 3.4–10.8)

## 2023-11-23 LAB — CMP14+EGFR
ALT: 15 IU/L (ref 0–32)
AST: 26 IU/L (ref 0–40)
Albumin: 4.2 g/dL (ref 3.8–4.9)
Alkaline Phosphatase: 97 IU/L (ref 49–135)
BUN/Creatinine Ratio: 13 (ref 9–23)
BUN: 10 mg/dL (ref 6–24)
Bilirubin Total: 0.5 mg/dL (ref 0.0–1.2)
CO2: 19 mmol/L — ABNORMAL LOW (ref 20–29)
Calcium: 9.6 mg/dL (ref 8.7–10.2)
Chloride: 99 mmol/L (ref 96–106)
Creatinine, Ser: 0.76 mg/dL (ref 0.57–1.00)
Globulin, Total: 3.3 g/dL (ref 1.5–4.5)
Glucose: 83 mg/dL (ref 70–99)
Potassium: 4 mmol/L (ref 3.5–5.2)
Sodium: 137 mmol/L (ref 134–144)
Total Protein: 7.5 g/dL (ref 6.0–8.5)
eGFR: 92 mL/min/1.73 (ref >59)

## 2023-11-23 LAB — TSH+FREE T4
Free T4: 1.04 ng/dL (ref 0.82–1.77)
TSH: 3.08 u[IU]/mL (ref 0.450–4.500)

## 2023-11-23 LAB — LIPID PANEL
Chol/HDL Ratio: 4 ratio (ref 0.0–4.4)
Cholesterol, Total: 186 mg/dL (ref 100–199)
HDL: 46 mg/dL (ref >39)
LDL Chol Calc (NIH): 107 mg/dL — ABNORMAL HIGH (ref 0–99)
Triglycerides: 188 mg/dL — ABNORMAL HIGH (ref 0–149)
VLDL Cholesterol Cal: 33 mg/dL (ref 5–40)

## 2023-11-23 LAB — HEMOGLOBIN A1C
Est. average glucose Bld gHb Est-mCnc: 117 mg/dL
Hgb A1c MFr Bld: 5.7 % — ABNORMAL HIGH (ref 4.8–5.6)

## 2023-11-23 LAB — VITAMIN D 25 HYDROXY (VIT D DEFICIENCY, FRACTURES): Vit D, 25-Hydroxy: 29.9 ng/mL — ABNORMAL LOW (ref 30.0–100.0)

## 2023-11-23 LAB — B12 AND FOLATE PANEL
Folate: 9.5 ng/mL (ref >3.0)
Vitamin B-12: 910 pg/mL (ref 232–1245)

## 2023-11-26 ENCOUNTER — Other Ambulatory Visit: Payer: Self-pay

## 2023-11-26 ENCOUNTER — Ambulatory Visit: Payer: Self-pay

## 2023-11-26 DIAGNOSIS — E559 Vitamin D deficiency, unspecified: Secondary | ICD-10-CM

## 2023-11-26 DIAGNOSIS — R030 Elevated blood-pressure reading, without diagnosis of hypertension: Secondary | ICD-10-CM | POA: Insufficient documentation

## 2023-11-26 MED ORDER — VITAMIN D (ERGOCALCIFEROL) 1.25 MG (50000 UNIT) PO CAPS: 50000.0000 [IU] | ORAL_CAPSULE | ORAL | 5 refills | Status: AC

## 2023-11-26 NOTE — Assessment & Plan Note (Signed)
 Currently prescribed rosuvastatin  5 mg daily.  We discussed focusing on dietary changes in an effort to lower her cholesterol.  Repeat lipid panel today.

## 2023-11-26 NOTE — Assessment & Plan Note (Addendum)
 BMI 32.45.  Modifications aimed at weight loss were reviewed today.  Screening labs ordered.

## 2023-11-26 NOTE — Assessment & Plan Note (Signed)
 EKG showed normal sinus rhythm, no signs of ischemia or abnormal rhythm.  Discussed need for low-sodium diet and regular exercise to help lower blood pressure.  Recommend returning to clinic for blood pressure check in 1 to 2 weeks.

## 2023-11-26 NOTE — Assessment & Plan Note (Signed)
 Previously documented history of vitamin D deficiency.  Reports that she is on daily vitamin D supplementation. -Repeat vitamin D level ordered today

## 2024-01-03 ENCOUNTER — Ambulatory Visit

## 2024-03-19 ENCOUNTER — Ambulatory Visit: Payer: Self-pay
